# Patient Record
Sex: Female | Born: 1975 | Race: Black or African American | Hispanic: No | Marital: Single | Smoking: Never smoker
Health system: Southern US, Community
[De-identification: ages and names within clinical notes are randomized; demographics above are authoritative.]

## PROBLEM LIST (undated history)

## (undated) DIAGNOSIS — R112 Nausea with vomiting, unspecified: Secondary | ICD-10-CM

## (undated) DIAGNOSIS — D649 Anemia, unspecified: Secondary | ICD-10-CM

## (undated) DIAGNOSIS — Z9889 Other specified postprocedural states: Secondary | ICD-10-CM

## (undated) DIAGNOSIS — I10 Essential (primary) hypertension: Secondary | ICD-10-CM

## (undated) DIAGNOSIS — Z9289 Personal history of other medical treatment: Secondary | ICD-10-CM

## (undated) HISTORY — PX: ABDOMINAL HYSTERECTOMY: SHX81

---

## 2020-05-11 ENCOUNTER — Other Ambulatory Visit (HOSPITAL_COMMUNITY)
Admission: RE | Admit: 2020-05-11 | Discharge: 2020-05-11 | Disposition: A | Discharge status: Home / Self Care | Payer: Medicaid Other | Source: Ambulatory Visit | Attending: Orthopedic Surgery | Admitting: Orthopedic Surgery

## 2020-05-11 ENCOUNTER — Encounter (HOSPITAL_COMMUNITY): Payer: Self-pay | Admitting: Orthopedic Surgery

## 2020-05-11 DIAGNOSIS — Z01812 Encounter for preprocedural laboratory examination: Secondary | ICD-10-CM | POA: Insufficient documentation

## 2020-05-11 DIAGNOSIS — Z20822 Contact with and (suspected) exposure to covid-19: Secondary | ICD-10-CM | POA: Insufficient documentation

## 2020-05-11 LAB — SARS CORONAVIRUS 2 (TAT 6-24 HRS): SARS Coronavirus 2: NEGATIVE

## 2020-05-12 ENCOUNTER — Other Ambulatory Visit: Payer: Self-pay

## 2020-05-12 ENCOUNTER — Ambulatory Visit (HOSPITAL_COMMUNITY)
Admission: RE | Admit: 2020-05-12 | Discharge: 2020-05-12 | Disposition: A | Discharge status: Home / Self Care | Payer: Medicaid Other | Source: Ambulatory Visit | Attending: Orthopedic Surgery | Admitting: Orthopedic Surgery

## 2020-05-12 ENCOUNTER — Encounter (HOSPITAL_COMMUNITY): Payer: Self-pay | Admitting: Orthopedic Surgery

## 2020-05-12 ENCOUNTER — Other Ambulatory Visit (HOSPITAL_COMMUNITY): Payer: Self-pay | Admitting: Orthopedic Surgery

## 2020-05-12 ENCOUNTER — Other Ambulatory Visit: Payer: Self-pay | Admitting: Orthopedic Surgery

## 2020-05-12 DIAGNOSIS — M25571 Pain in right ankle and joints of right foot: Secondary | ICD-10-CM | POA: Diagnosis not present

## 2020-05-12 DIAGNOSIS — G8929 Other chronic pain: Secondary | ICD-10-CM | POA: Diagnosis present

## 2020-05-12 NOTE — H&P (Signed)
HPI:  Evelyn Moody is a pleasant 45 year old female who fell down some stairs while she was in [place] on 05/06/2020.  She had acute onset pain.  She actually went to one hospital and they shipped her to a second hospital.  She ultimately had a long-leg splint placed.  Pain is rated 8 out of 10.  She has had swelling.  She has not been able to put weight on it.  She has been using a wheelchair.  She is taking ibuprofen and Tylenol.    Past Medical History:  Diagnosis Date  . Hypertension         No family history on file.   Current Medications No current facility-administered medications for this encounter.   Marland Kitchen acetaminophen (TYLENOL) 500 MG tablet  . amLODipine (NORVASC) 10 MG tablet  . hydrochlorothiazide (HYDRODIURIL) 25 MG tablet  . ibuprofen (ADVIL) 600 MG tablet     Allergies Patient has no known allergies.  Social History  has no history on file for tobacco use, alcohol use, and drug use.    Physical Exam General: Well appearing, in NAD Mental status: Alert and Oriented x3 Lungs: CTA b/l anterior and posterior without crackles or wheeze Heart: RRR, no m/g/r appreciated Abdomen: +BS, soft, nt, nd, no masses, hernias, or organomegaly appreciated Neurological: Speech Clear and organized, no gross focal findings or movement disorder appreciated Musculoskeletal: no gross joint deformity or swelling.  Observed gait normal Extremities: RLE: Her splint is somewhat in disrepair.  Her sensation is intact throughout the toes.  No evidence for compartment syndrome.  EHL and FHL are intact.  Warm and well perfused w/o edema Skin: Warm and dry      X-RAYS: X-rays from the ER demonstrate a comminuted displaced distal tibia fracture with a midshaft fibula fracture   PLAN: This is an acute severe injury and carries risk for post-traumatic arthrosis and long-term dysfunction.  Recommended open reduction and internal fixation, but we need to optimize the soft tissues.  We will get a CAT  scan to better elucidate the posterior malleolus at the tibia. There may be involvement with impaction fracture there. The risks, alternatives, benefits were discussed at length with the pt. Who agrees with the plan and wishes to proceed.

## 2020-05-12 NOTE — Progress Notes (Signed)
COVID Vaccine Completed: No Date COVID Vaccine completed: x1 COVID vaccine manufacturer: Pfizer      PCP - [facility] [place] Cardiologist - N/A  Chest x-ray - N/A EKG -  Stress Test - greater than 2 years ECHO - N/A Cardiac Cath - N/A Pacemaker/ICD device last checked: N/A  Sleep Study - N/A CPAP - N/A  Fasting Blood Sugar - N/A Checks Blood Sugar _N/A____ times a day  Blood Thinner Instructions: N/A Aspirin Instructions: N/A Last Dose: N/A  Activity level:  Can go up a flight of stairs without stopping and without symptoms    Anesthesia review: N/A  Patient denies shortness of breath, fever, cough and chest pain at PAT appointment   Patient verbalized understanding of instructions that were given to them at the PAT appointment. Patient was also instructed that they will need to review over the PAT instructions again at home before surgery.

## 2020-05-14 ENCOUNTER — Encounter (HOSPITAL_COMMUNITY)
Admission: RE | Disposition: A | Discharge status: Home / Self Care | Payer: Self-pay | Source: Home / Self Care | Attending: Orthopedic Surgery

## 2020-05-14 ENCOUNTER — Encounter (HOSPITAL_COMMUNITY): Payer: Self-pay | Admitting: Orthopedic Surgery

## 2020-05-14 ENCOUNTER — Ambulatory Visit (HOSPITAL_COMMUNITY): Payer: Medicaid Other | Admitting: Anesthesiology

## 2020-05-14 ENCOUNTER — Ambulatory Visit (HOSPITAL_COMMUNITY): Payer: Medicaid Other

## 2020-05-14 ENCOUNTER — Other Ambulatory Visit: Payer: Self-pay

## 2020-05-14 ENCOUNTER — Observation Stay (HOSPITAL_COMMUNITY)
Admission: RE | Admit: 2020-05-14 | Discharge: 2020-05-16 | Disposition: A | Discharge status: Home / Self Care | Payer: Medicaid Other | Attending: Orthopedic Surgery | Admitting: Orthopedic Surgery

## 2020-05-14 DIAGNOSIS — S82201A Unspecified fracture of shaft of right tibia, initial encounter for closed fracture: Secondary | ICD-10-CM | POA: Diagnosis present

## 2020-05-14 DIAGNOSIS — S82871A Displaced pilon fracture of right tibia, initial encounter for closed fracture: Secondary | ICD-10-CM | POA: Diagnosis not present

## 2020-05-14 DIAGNOSIS — Z79899 Other long term (current) drug therapy: Secondary | ICD-10-CM | POA: Diagnosis not present

## 2020-05-14 DIAGNOSIS — Z419 Encounter for procedure for purposes other than remedying health state, unspecified: Secondary | ICD-10-CM

## 2020-05-14 DIAGNOSIS — W109XXA Fall (on) (from) unspecified stairs and steps, initial encounter: Secondary | ICD-10-CM | POA: Diagnosis not present

## 2020-05-14 DIAGNOSIS — I10 Essential (primary) hypertension: Secondary | ICD-10-CM | POA: Insufficient documentation

## 2020-05-14 DIAGNOSIS — S8991XA Unspecified injury of right lower leg, initial encounter: Secondary | ICD-10-CM | POA: Diagnosis present

## 2020-05-14 HISTORY — DX: Anemia, unspecified: D64.9

## 2020-05-14 HISTORY — DX: Essential (primary) hypertension: I10

## 2020-05-14 HISTORY — DX: Personal history of other medical treatment: Z92.89

## 2020-05-14 HISTORY — PX: ORIF TIBIA FRACTURE: SHX5416

## 2020-05-14 HISTORY — DX: Other specified postprocedural states: R11.2

## 2020-05-14 HISTORY — DX: Other specified postprocedural states: Z98.890

## 2020-05-14 LAB — CBC
HCT: 40.2 % (ref 36.0–46.0)
Hemoglobin: 12.8 g/dL (ref 12.0–15.0)
MCH: 28.6 pg (ref 26.0–34.0)
MCHC: 31.8 g/dL (ref 30.0–36.0)
MCV: 89.9 fL (ref 80.0–100.0)
Platelets: 325 10*3/uL (ref 150–400)
RBC: 4.47 MIL/uL (ref 3.87–5.11)
RDW: 13.5 % (ref 11.5–15.5)
WBC: 6.6 10*3/uL (ref 4.0–10.5)
nRBC: 0 % (ref 0.0–0.2)

## 2020-05-14 LAB — BASIC METABOLIC PANEL
Anion gap: 13 (ref 5–15)
BUN: 14 mg/dL (ref 6–20)
CO2: 26 mmol/L (ref 22–32)
Calcium: 9.4 mg/dL (ref 8.9–10.3)
Chloride: 106 mmol/L (ref 98–111)
Creatinine, Ser: 0.9 mg/dL (ref 0.44–1.00)
GFR, Estimated: >60 mL/min (ref >60)
Glucose, Bld: 97 mg/dL (ref 70–99)
Potassium: 4.2 mmol/L (ref 3.5–5.1)
Sodium: 145 mmol/L (ref 135–145)

## 2020-05-14 SURGERY — OPEN REDUCTION INTERNAL FIXATION (ORIF) TIBIA FRACTURE
Anesthesia: General | Laterality: Right

## 2020-05-14 MED ORDER — ROCURONIUM BROMIDE 100 MG/10ML IV SOLN
INTRAVENOUS | Status: DC | PRN
  Administered 2020-05-14: 50 mg via INTRAVENOUS
  Administered 2020-05-14: 10 mg via INTRAVENOUS

## 2020-05-14 MED ORDER — HYDROMORPHONE HCL 1 MG/ML IJ SOLN
0.2500 mg | INTRAMUSCULAR | Status: DC | PRN
Start: 2020-05-14 — End: 2020-05-14
  Administered 2020-05-14 (×4): 0.5 mg via INTRAVENOUS

## 2020-05-14 MED ORDER — METHOCARBAMOL 500 MG IVPB - SIMPLE MED
500.0000 mg | Freq: Four times a day (QID) | INTRAVENOUS | Status: DC | PRN
  Filled 2020-05-14: qty 50

## 2020-05-14 MED ORDER — ORAL CARE MOUTH RINSE: 15.0000 mL | Freq: Once | OROMUCOSAL | Status: AC

## 2020-05-14 MED ORDER — ONDANSETRON HCL 4 MG/2ML IJ SOLN: 4.0000 mg | Freq: Four times a day (QID) | INTRAMUSCULAR | Status: DC | PRN

## 2020-05-14 MED ORDER — ACETAMINOPHEN 500 MG PO TABS
1000.0000 mg | ORAL_TABLET | Freq: Four times a day (QID) | ORAL | Status: AC
  Administered 2020-05-14 – 2020-05-15 (×4): 1000 mg via ORAL
  Filled 2020-05-14 (×4): qty 2

## 2020-05-14 MED ORDER — PROPOFOL 10 MG/ML IV BOLUS
INTRAVENOUS | Status: DC | PRN
  Administered 2020-05-14: 200 mg via INTRAVENOUS

## 2020-05-14 MED ORDER — CEFAZOLIN SODIUM-DEXTROSE 2-4 GM/100ML-% IV SOLN
2.0000 g | Freq: Four times a day (QID) | INTRAVENOUS | Status: AC
Start: 2020-05-14 — End: 2020-05-15
  Administered 2020-05-14 – 2020-05-15 (×3): 2 g via INTRAVENOUS
  Filled 2020-05-14 (×4): qty 100

## 2020-05-14 MED ORDER — BUPIVACAINE HCL (PF) 0.5 % IJ SOLN
INTRAMUSCULAR | Status: AC
  Filled 2020-05-14: qty 30

## 2020-05-14 MED ORDER — ACETAMINOPHEN 500 MG PO TABS: 1000.0000 mg | ORAL_TABLET | Freq: Four times a day (QID) | ORAL | Status: DC

## 2020-05-14 MED ORDER — FENTANYL CITRATE (PF) 100 MCG/2ML IJ SOLN
INTRAMUSCULAR | Status: AC
  Filled 2020-05-14: qty 2

## 2020-05-14 MED ORDER — ONDANSETRON HCL 4 MG PO TABS
4.0000 mg | ORAL_TABLET | Freq: Four times a day (QID) | ORAL | Status: DC | PRN
  Administered 2020-05-15: 4 mg via ORAL
  Filled 2020-05-14: qty 1

## 2020-05-14 MED ORDER — OXYCODONE HCL 5 MG PO TABS: 5.0000 mg | ORAL_TABLET | ORAL | Status: DC | PRN

## 2020-05-14 MED ORDER — METOCLOPRAMIDE HCL 5 MG PO TABS: 5.0000 mg | ORAL_TABLET | Freq: Three times a day (TID) | ORAL | Status: DC | PRN

## 2020-05-14 MED ORDER — SENNOSIDES-DOCUSATE SODIUM 8.6-50 MG PO TABS
1.0000 | ORAL_TABLET | Freq: Every evening | ORAL | Status: DC | PRN
  Filled 2020-05-14: qty 1

## 2020-05-14 MED ORDER — CHLORHEXIDINE GLUCONATE 0.12 % MT SOLN
15.0000 mL | Freq: Once | OROMUCOSAL | Status: AC
  Administered 2020-05-14: 15 mL via OROMUCOSAL

## 2020-05-14 MED ORDER — PROPOFOL 10 MG/ML IV BOLUS
INTRAVENOUS | Status: AC
  Filled 2020-05-14: qty 20

## 2020-05-14 MED ORDER — LIDOCAINE HCL (PF) 2 % IJ SOLN
INTRAMUSCULAR | Status: AC
  Filled 2020-05-14: qty 5

## 2020-05-14 MED ORDER — ONDANSETRON HCL 4 MG/2ML IJ SOLN: 4.0000 mg | Freq: Once | INTRAMUSCULAR | Status: DC | PRN

## 2020-05-14 MED ORDER — SENNOSIDES-DOCUSATE SODIUM 8.6-50 MG PO TABS: 1.0000 | ORAL_TABLET | Freq: Every evening | ORAL | Status: DC | PRN

## 2020-05-14 MED ORDER — MIDAZOLAM HCL 5 MG/5ML IJ SOLN
INTRAMUSCULAR | Status: DC | PRN
  Administered 2020-05-14: 2 mg via INTRAVENOUS

## 2020-05-14 MED ORDER — SUCCINYLCHOLINE CHLORIDE 200 MG/10ML IV SOSY
PREFILLED_SYRINGE | INTRAVENOUS | Status: AC
  Filled 2020-05-14: qty 10

## 2020-05-14 MED ORDER — CEFAZOLIN SODIUM-DEXTROSE 2-4 GM/100ML-% IV SOLN
2.0000 g | INTRAVENOUS | Status: AC
  Administered 2020-05-14: 2 g via INTRAVENOUS
  Filled 2020-05-14: qty 100

## 2020-05-14 MED ORDER — OXYCODONE HCL 5 MG/5ML PO SOLN: 5.0000 mg | Freq: Once | ORAL | Status: DC | PRN

## 2020-05-14 MED ORDER — BISACODYL 10 MG RE SUPP
10.0000 mg | Freq: Every day | RECTAL | Status: DC | PRN
Start: 2020-05-14 — End: 2020-05-16

## 2020-05-14 MED ORDER — OXYCODONE HCL 5 MG PO TABS
5.0000 mg | ORAL_TABLET | ORAL | Status: DC | PRN
  Filled 2020-05-14 (×2): qty 2

## 2020-05-14 MED ORDER — DOCUSATE SODIUM 100 MG PO CAPS
100.0000 mg | ORAL_CAPSULE | Freq: Two times a day (BID) | ORAL | Status: DC
  Administered 2020-05-15 – 2020-05-16 (×3): 100 mg via ORAL
  Filled 2020-05-14 (×3): qty 1

## 2020-05-14 MED ORDER — SUGAMMADEX SODIUM 200 MG/2ML IV SOLN
INTRAVENOUS | Status: DC | PRN
  Administered 2020-05-14: 200 mg via INTRAVENOUS

## 2020-05-14 MED ORDER — HYDROMORPHONE HCL 1 MG/ML IJ SOLN
INTRAMUSCULAR | Status: AC
  Filled 2020-05-14: qty 1

## 2020-05-14 MED ORDER — LACTATED RINGERS IV SOLN: INTRAVENOUS | Status: DC

## 2020-05-14 MED ORDER — METOPROLOL TARTRATE 5 MG/5ML IV SOLN
INTRAVENOUS | Status: AC
  Filled 2020-05-14: qty 5

## 2020-05-14 MED ORDER — DEXMEDETOMIDINE (PRECEDEX) IN NS 20 MCG/5ML (4 MCG/ML) IV SYRINGE
PREFILLED_SYRINGE | INTRAVENOUS | Status: DC | PRN
  Administered 2020-05-14 (×5): 4 ug via INTRAVENOUS

## 2020-05-14 MED ORDER — ONDANSETRON HCL 4 MG/2ML IJ SOLN
INTRAMUSCULAR | Status: AC
  Filled 2020-05-14: qty 2

## 2020-05-14 MED ORDER — DIPHENHYDRAMINE HCL 12.5 MG/5ML PO ELIX
12.5000 mg | ORAL_SOLUTION | ORAL | Status: DC | PRN
  Filled 2020-05-14: qty 10

## 2020-05-14 MED ORDER — OXYCODONE HCL 5 MG PO TABS: 5.0000 mg | ORAL_TABLET | Freq: Once | ORAL | Status: DC | PRN

## 2020-05-14 MED ORDER — ROCURONIUM BROMIDE 100 MG/10ML IV SOLN: INTRAVENOUS | Status: DC | PRN

## 2020-05-14 MED ORDER — DEXMEDETOMIDINE (PRECEDEX) IN NS 20 MCG/5ML (4 MCG/ML) IV SYRINGE
PREFILLED_SYRINGE | INTRAVENOUS | Status: AC
  Filled 2020-05-14: qty 5

## 2020-05-14 MED ORDER — KETOROLAC TROMETHAMINE 30 MG/ML IJ SOLN: 30.0000 mg | Freq: Once | INTRAMUSCULAR | Status: DC | PRN

## 2020-05-14 MED ORDER — LABETALOL HCL 5 MG/ML IV SOLN
INTRAVENOUS | Status: DC | PRN
  Administered 2020-05-14 (×2): 5 mg via INTRAVENOUS
  Administered 2020-05-14: 10 mg via INTRAVENOUS

## 2020-05-14 MED ORDER — HYDROMORPHONE HCL 1 MG/ML IJ SOLN
0.5000 mg | INTRAMUSCULAR | Status: DC | PRN
  Filled 2020-05-14: qty 1

## 2020-05-14 MED ORDER — BUPIVACAINE HCL (PF) 0.5 % IJ SOLN
INTRAMUSCULAR | Status: DC | PRN
  Administered 2020-05-14: 30 mL via PERINEURAL

## 2020-05-14 MED ORDER — DEXAMETHASONE SODIUM PHOSPHATE 10 MG/ML IJ SOLN
INTRAMUSCULAR | Status: DC | PRN
  Administered 2020-05-14: 10 mg via INTRAVENOUS

## 2020-05-14 MED ORDER — BUPIVACAINE HCL (PF) 0.25 % IJ SOLN
INTRAMUSCULAR | Status: AC
  Filled 2020-05-14: qty 30

## 2020-05-14 MED ORDER — OXYCODONE HCL 5 MG PO TABS
10.0000 mg | ORAL_TABLET | ORAL | Status: DC | PRN
Start: 2020-05-14 — End: 2020-05-16
  Administered 2020-05-14: 10 mg via ORAL
  Administered 2020-05-15 (×2): 15 mg via ORAL
  Administered 2020-05-15: 10 mg via ORAL
  Administered 2020-05-16: 15 mg via ORAL
  Filled 2020-05-14 (×3): qty 3

## 2020-05-14 MED ORDER — DEXAMETHASONE SODIUM PHOSPHATE 10 MG/ML IJ SOLN
INTRAMUSCULAR | Status: AC
  Filled 2020-05-14: qty 1

## 2020-05-14 MED ORDER — 0.9 % SODIUM CHLORIDE (POUR BTL) OPTIME
TOPICAL | Status: DC | PRN
  Administered 2020-05-14: 1000 mL

## 2020-05-14 MED ORDER — ONDANSETRON HCL 4 MG/2ML IJ SOLN
INTRAMUSCULAR | Status: DC | PRN
  Administered 2020-05-14: 4 mg via INTRAVENOUS

## 2020-05-14 MED ORDER — SUCCINYLCHOLINE CHLORIDE 200 MG/10ML IV SOSY
PREFILLED_SYRINGE | INTRAVENOUS | Status: DC | PRN
  Administered 2020-05-14: 140 mg via INTRAVENOUS

## 2020-05-14 MED ORDER — METOPROLOL TARTRATE 5 MG/5ML IV SOLN
INTRAVENOUS | Status: DC | PRN
  Administered 2020-05-14: 2 mg via INTRAVENOUS
  Administered 2020-05-14: 3 mg via INTRAVENOUS

## 2020-05-14 MED ORDER — OXYCODONE HCL 5 MG PO TABS: 5.0000 mg | ORAL_TABLET | Freq: Three times a day (TID) | ORAL | 0 refills | Status: AC | PRN

## 2020-05-14 MED ORDER — AMISULPRIDE (ANTIEMETIC) 5 MG/2ML IV SOLN: 10.0000 mg | Freq: Once | INTRAVENOUS | Status: DC | PRN

## 2020-05-14 MED ORDER — FENTANYL CITRATE (PF) 250 MCG/5ML IJ SOLN
INTRAMUSCULAR | Status: AC
  Filled 2020-05-14: qty 5

## 2020-05-14 MED ORDER — HYDRALAZINE HCL 20 MG/ML IJ SOLN
INTRAMUSCULAR | Status: DC | PRN
  Administered 2020-05-14: 10 mg via INTRAVENOUS

## 2020-05-14 MED ORDER — HYDROMORPHONE HCL 1 MG/ML IJ SOLN: 0.5000 mg | INTRAMUSCULAR | Status: DC | PRN

## 2020-05-14 MED ORDER — LABETALOL HCL 5 MG/ML IV SOLN
INTRAVENOUS | Status: AC
  Filled 2020-05-14: qty 4

## 2020-05-14 MED ORDER — HYDRALAZINE HCL 20 MG/ML IJ SOLN
INTRAMUSCULAR | Status: AC
  Filled 2020-05-14: qty 1

## 2020-05-14 MED ORDER — ASPIRIN 81 MG PO CHEW: 81.0000 mg | CHEWABLE_TABLET | Freq: Two times a day (BID) | ORAL | 0 refills | Status: AC

## 2020-05-14 MED ORDER — ONDANSETRON HCL 4 MG PO TABS: 4.0000 mg | ORAL_TABLET | Freq: Every day | ORAL | 0 refills | Status: AC | PRN

## 2020-05-14 MED ORDER — LIDOCAINE HCL (CARDIAC) PF 100 MG/5ML IV SOSY
PREFILLED_SYRINGE | INTRAVENOUS | Status: DC | PRN
  Administered 2020-05-14: 100 mg via INTRAVENOUS

## 2020-05-14 MED ORDER — ROCURONIUM BROMIDE 10 MG/ML (PF) SYRINGE
PREFILLED_SYRINGE | INTRAVENOUS | Status: AC
  Filled 2020-05-14: qty 10

## 2020-05-14 MED ORDER — MIDAZOLAM HCL 2 MG/2ML IJ SOLN
INTRAMUSCULAR | Status: AC
  Filled 2020-05-14: qty 2

## 2020-05-14 MED ORDER — BISACODYL 10 MG RE SUPP
10.0000 mg | Freq: Every day | RECTAL | Status: DC | PRN
  Filled 2020-05-14: qty 1

## 2020-05-14 MED ORDER — METHOCARBAMOL 500 MG PO TABS: 500.0000 mg | ORAL_TABLET | Freq: Four times a day (QID) | ORAL | Status: DC | PRN

## 2020-05-14 MED ORDER — FENTANYL CITRATE (PF) 100 MCG/2ML IJ SOLN
INTRAMUSCULAR | Status: DC | PRN
  Administered 2020-05-14: 25 ug via INTRAVENOUS
  Administered 2020-05-14: 100 ug via INTRAVENOUS
  Administered 2020-05-14 (×3): 25 ug via INTRAVENOUS
  Administered 2020-05-14: 100 ug via INTRAVENOUS
  Administered 2020-05-14: 50 ug via INTRAVENOUS
  Administered 2020-05-14: 100 ug via INTRAVENOUS

## 2020-05-14 MED ORDER — OXYCODONE HCL 5 MG PO TABS: 10.0000 mg | ORAL_TABLET | ORAL | Status: DC | PRN

## 2020-05-14 MED ORDER — METOCLOPRAMIDE HCL 5 MG/ML IJ SOLN: 5.0000 mg | Freq: Three times a day (TID) | INTRAMUSCULAR | Status: DC | PRN

## 2020-05-14 SURGICAL SUPPLY — 96 items
BAG DECANTER FOR FLEXI CONT (MISCELLANEOUS) IMPLANT
BANDAGE ESMARK 6X9 LF (GAUZE/BANDAGES/DRESSINGS) ×2 IMPLANT
BIT DRILL 2.5 (BIT) ×1
BIT DRILL 3.1 (BIT) ×2 IMPLANT
BIT DRILL CANN 2.7 (BIT) ×1
BIT DRILL SHRT 216X2.5XNS LF (BIT) ×1 IMPLANT
BIT DRILL SRG 2.7XCANN AO CPLG (BIT) ×1 IMPLANT
BIT DRL SHRT 216X2.5XNS LF (BIT) ×1
BIT DRL SRG 2.7XCANN AO CPLNG (BIT) ×1
BLADE SURG 15 STRL LF DISP TIS (BLADE) ×2 IMPLANT
BLADE SURG 15 STRL SS (BLADE) ×2
BNDG ELASTIC 4X5.8 VLCR STR LF (GAUZE/BANDAGES/DRESSINGS) ×2 IMPLANT
BNDG ELASTIC 6X5.8 VLCR STR LF (GAUZE/BANDAGES/DRESSINGS) ×4 IMPLANT
BNDG ESMARK 6X9 LF (GAUZE/BANDAGES/DRESSINGS) ×4
CHLORAPREP W/TINT 26 (MISCELLANEOUS) ×2 IMPLANT
CLSR STERI-STRIP ANTIMIC 1/2X4 (GAUZE/BANDAGES/DRESSINGS) IMPLANT
COVER WAND RF STERILE (DRAPES) IMPLANT
CUFF TOURN SGL QUICK 34 (TOURNIQUET CUFF)
CUFF TRNQT CYL 34X4.125X (TOURNIQUET CUFF) IMPLANT
DRAPE C-ARM 42X120 X-RAY (DRAPES) ×2 IMPLANT
DRAPE C-ARMOR (DRAPES) ×2 IMPLANT
DRAPE EXTREMITY ABCS (DRAPES) ×2 IMPLANT
DRAPE EXTREMITY T 121X128X90 (DISPOSABLE) ×2 IMPLANT
DRAPE IMP U-DRAPE 54X76 (DRAPES) ×4 IMPLANT
DRAPE U-SHAPE 47X51 STRL (DRAPES) ×2 IMPLANT
DRSG ADAPTIC 3X8 NADH LF (GAUZE/BANDAGES/DRESSINGS) ×2 IMPLANT
ELECT PENCIL ROCKER SW 15FT (MISCELLANEOUS) ×2 IMPLANT
GAUZE SPONGE 4X4 12PLY STRL (GAUZE/BANDAGES/DRESSINGS) ×2 IMPLANT
GAUZE XEROFORM 1X8 LF (GAUZE/BANDAGES/DRESSINGS) ×2 IMPLANT
GLOVE BIO SURGEON STRL SZ7.5 (GLOVE) ×4 IMPLANT
GLOVE SRG 8 PF TXTR STRL LF DI (GLOVE) ×2 IMPLANT
GLOVE SURG PROTEXIS BL SZ6.5 (GLOVE) ×2 IMPLANT
GLOVE SURG UNDER POLY LF SZ8 (GLOVE) ×2
GOWN STRL REUS W/ TWL LRG LVL3 (GOWN DISPOSABLE) ×2 IMPLANT
GOWN STRL REUS W/ TWL XL LVL3 (GOWN DISPOSABLE) ×1 IMPLANT
GOWN STRL REUS W/TWL LRG LVL3 (GOWN DISPOSABLE) ×2
GOWN STRL REUS W/TWL XL LVL3 (GOWN DISPOSABLE) ×3 IMPLANT
IMMOBILIZER KNEE 20 (SOFTGOODS) ×2
IMMOBILIZER KNEE 20 THIGH 36 (SOFTGOODS) ×1 IMPLANT
IMMOBILIZER KNEE 22 UNIV (SOFTGOODS) IMPLANT
K-WIRE 2.0 (WIRE) ×1
K-WIRE FX234X2XDRL TIP NS (WIRE) ×1
K-WIRE ORTHOPEDIC 1.4X150L (WIRE) ×2
KIT 1/3 TUB PL 4H 49M (Orthopedic Implant) ×1 IMPLANT
KIT BASIN OR (CUSTOM PROCEDURE TRAY) ×2 IMPLANT
KWIRE FX234X2XDRL TIP NS (WIRE) ×1 IMPLANT
KWIRE ORTHOPEDIC 1.4X150L (WIRE) ×1 IMPLANT
MANIFOLD NEPTUNE II (INSTRUMENTS) ×2 IMPLANT
NDL SAFETY ECLIPSE 18X1.5 (NEEDLE) ×1 IMPLANT
NEEDLE HYPO 18GX1.5 SHARP (NEEDLE) ×1
NS IRRIG 1000ML POUR BTL (IV SOLUTION) ×2 IMPLANT
PACK TOTAL JOINT (CUSTOM PROCEDURE TRAY) ×2 IMPLANT
PAD CAST 4YDX4 CTTN HI CHSV (CAST SUPPLIES) ×4 IMPLANT
PADDING CAST COTTON 4X4 STRL (CAST SUPPLIES) ×4
PADDING CAST COTTON 6X4 STRL (CAST SUPPLIES) ×4 IMPLANT
PENCIL SMOKE EVACUATOR (MISCELLANEOUS) ×2 IMPLANT
PLATE TIB ANT RT 4H (Plate) ×2 IMPLANT
PROS 1/3 TUB PL 4H 49M (Orthopedic Implant) ×2 IMPLANT
SCREW 44X4.0MM (Screw) ×2 IMPLANT
SCREW BONE 38X3.5MM (Screw) ×2 IMPLANT
SCREW BONE 40X3.5MM (Screw) ×2 IMPLANT
SCREW BONE CORT 3.5X24MM (Screw) ×2 IMPLANT
SCREW CANC FT 4.0X20 (Screw) ×4 IMPLANT
SCREW CANC FT 4.0X28 (Screw) ×2 IMPLANT
SCREW CORT 28X3.5XST LCK NS (Screw) ×2 IMPLANT
SCREW CORT 30X3.5XST LCK NS (Screw) ×1 IMPLANT
SCREW CORT 32X3.5XST LCK NS (Screw) ×1 IMPLANT
SCREW CORT 34X3.5XST LCK NS (Screw) ×1 IMPLANT
SCREW CORTICAL 3.5X28MM (Screw) ×2 IMPLANT
SCREW CORTICAL 3.5X30MM (Screw) ×1 IMPLANT
SCREW CORTICAL 3.5X32MM (Screw) ×1 IMPLANT
SCREW CORTICAL 3.5X34MM (Screw) ×1 IMPLANT
SLEEVE SCD COMPRESS KNEE MED (MISCELLANEOUS) ×2 IMPLANT
SPLINT PLASTER CAST XFAST 5X30 (CAST SUPPLIES) ×1 IMPLANT
SPLINT PLASTER XFAST SET 5X30 (CAST SUPPLIES) ×1
SPONGE LAP 18X18 RF (DISPOSABLE) ×2 IMPLANT
SPONGE LAP 4X18 RFD (DISPOSABLE) ×2 IMPLANT
STAPLER VISISTAT 35W (STAPLE) IMPLANT
SUCTION FRAZIER HANDLE 10FR (MISCELLANEOUS) ×1
SUCTION TUBE FRAZIER 10FR DISP (MISCELLANEOUS) ×1 IMPLANT
SUT ETHILON 3 0 PS 1 (SUTURE) ×8 IMPLANT
SUT FIBERWIRE #2 38 T-5 BLUE (SUTURE)
SUT MNCRL AB 4-0 PS2 18 (SUTURE) ×2 IMPLANT
SUT MON AB 2-0 CT1 36 (SUTURE) ×2 IMPLANT
SUT STEEL 7 (SUTURE) IMPLANT
SUT VIC AB 0 CT1 27 (SUTURE) ×1
SUT VIC AB 0 CT1 27XBRD ANBCTR (SUTURE) ×1 IMPLANT
SUT VIC AB 1 CT1 27 (SUTURE) ×1
SUT VIC AB 1 CT1 27XBRD ANBCTR (SUTURE) ×1 IMPLANT
SUT VIC AB 2-0 SH 27 (SUTURE)
SUT VIC AB 2-0 SH 27XBRD (SUTURE) IMPLANT
SUTURE FIBERWR #2 38 T-5 BLUE (SUTURE) IMPLANT
SYR BULB EAR ULCER 3OZ GRN STR (SYRINGE) ×2 IMPLANT
SYR CONTROL 10ML LL (SYRINGE) ×2 IMPLANT
UNDERPAD 30X36 HEAVY ABSORB (UNDERPADS AND DIAPERS) ×2 IMPLANT
YANKAUER SUCT BULB TIP NO VENT (SUCTIONS) ×2 IMPLANT

## 2020-05-14 NOTE — Anesthesia Preprocedure Evaluation (Addendum)
Anesthesia Evaluation  Patient identified by MRN, date of birth, ID band Patient awake    Reviewed: Allergy & Precautions, H&P , NPO status , Patient's Chart, lab work & pertinent test results  History of Anesthesia Complications (+) PONV  Airway Mallampati: II  TM Distance: >3 FB Neck ROM: Full    Dental no notable dental hx.    Pulmonary neg pulmonary ROS,    Pulmonary exam normal breath sounds clear to auscultation       Cardiovascular hypertension, Pt. on medications Normal cardiovascular exam Rhythm:Regular Rate:Normal  Totally uncontrolled HTN Has not taken meds in at least 3 days   Neuro/Psych negative neurological ROS  negative psych ROS   GI/Hepatic negative GI ROS, Neg liver ROS,   Endo/Other  Morbid obesity  Renal/GU negative Renal ROS  negative genitourinary   Musculoskeletal negative musculoskeletal ROS (+)   Abdominal   Peds negative pediatric ROS (+)  Hematology negative hematology ROS (+)   Anesthesia Other Findings   Reproductive/Obstetrics negative OB ROS                            Anesthesia Physical Anesthesia Plan  ASA: III  Anesthesia Plan: General   Post-op Pain Management:    Induction: Intravenous  PONV Risk Score and Plan: 4 or greater and Ondansetron, Dexamethasone, Midazolam and Scopolamine patch - Pre-op  Airway Management Planned: Oral ETT  Additional Equipment:   Intra-op Plan:   Post-operative Plan: Extubation in OR  Informed Consent: I have reviewed the patients History and Physical, chart, labs and discussed the procedure including the risks, benefits and alternatives for the proposed anesthesia with the patient or authorized representative who has indicated his/her understanding and acceptance.     Dental advisory given  Plan Discussed with: CRNA and Surgeon  Anesthesia Plan Comments:         Anesthesia Quick Evaluation

## 2020-05-14 NOTE — Anesthesia Postprocedure Evaluation (Signed)
Anesthesia Post Note  Patient: Evelyn Moody  Procedure(s) Performed: OPEN REDUCTION INTERNAL FIXATION (ORIF) TIBIA FRACTURE (Right )     Patient location during evaluation: PACU Anesthesia Type: General Level of consciousness: awake and alert Pain management: pain level controlled Vital Signs Assessment: post-procedure vital signs reviewed and stable Respiratory status: spontaneous breathing, nonlabored ventilation, respiratory function stable and patient connected to nasal cannula oxygen Cardiovascular status: blood pressure returned to baseline and stable Postop Assessment: no apparent nausea or vomiting Anesthetic complications: no   No complications documented.  Last Vitals:  Vitals:   05/14/20 1720 05/14/20 1730  BP:  (!) 158/110  Pulse: 94 88  Resp: 16 18  Temp:    SpO2: 100% 99%    Last Pain:  Vitals:   05/14/20 1730  TempSrc:   PainSc: 0-No pain                 Aidin Doane S

## 2020-05-14 NOTE — Discharge Instructions (Addendum)
It is very important for you to Elevate your leg - Toes above nose as much as possible to reduce pain / swelling. If needed, you may increase pain medication for the first few days post op to 2 tablets every 4 hours.  Weight Bearing:  Non weight bearing on affected leg.  Diet: As you were doing prior to hospitalization   Shower:  You have a splint on, leave the splint in place and keep the splint dry with a plastic bag.  Dressing:  You have a splint. Leave the splint in place and we will change your bandages during your first follow-up appointment.  You may loosen and re-apply ace wrap if it feels too tight. You also have a knee immobilizer brace on your knee because you had a nerve block placed in your leg pre-operatively. Once the nerve block wears off, you can remove the knee immobilizer but keep the splint on your lower leg and ACE wrap in place.     Activity:  Increase activity slowly as tolerated, but follow the weight bearing instructions below.  The rules on driving is that you can not be taking narcotics while you drive, and you must feel in control of the vehicle. If you had surgery on your right leg, you cannot drive while in a splint.    To prevent constipation:  Narcotic medicines cause constipation.  Wean these as soon as is appropriate.   You may use a stool softener such as -  - Colace (over the counter) 100 mg by mouth twice a day  - Drink plenty of fluids (prune juice may be helpful) and high fiber foods - Miralax (over the counter) for constipation as needed.    Itching:  If you experience itching with your medications, try taking only a single pain pill, or even half a pain pill at a time.  You can also use benadryl over the counter for itching or also to help with sleep.   Precautions:  If you experience chest pain or shortness of breath - call 911 immediately for transfer to the hospital emergency department!!  If you develop a fever greater that 101 F, purulent  drainage from wound, increased redness or drainage from wound, or calf pain -- Call the office at [phone]                                                 Follow- Up Appointment:  Please call the office ASAP for an appointment to be seen in 1-2 weeks by Dr. Eulah Pont - [phone]

## 2020-05-14 NOTE — Anesthesia Procedure Notes (Signed)
Procedure Name: Intubation Date/Time: 05/14/2020 2:02 PM Performed by: Jonna Munro, CRNA Pre-anesthesia Checklist: Patient identified, Emergency Drugs available, Suction available, Patient being monitored and Timeout performed Patient Re-evaluated:Patient Re-evaluated prior to induction Oxygen Delivery Method: Circle system utilized Preoxygenation: Pre-oxygenation with 100% oxygen Induction Type: IV induction Ventilation: Mask ventilation without difficulty Laryngoscope Size: Mac and 3 Grade View: Grade I Tube type: Oral Tube size: 7.0 mm Number of attempts: 1 Airway Equipment and Method: Stylet Placement Confirmation: ETT inserted through vocal cords under direct vision,  positive ETCO2 and breath sounds checked- equal and bilateral Secured at: 22 cm Tube secured with: Tape Dental Injury: Teeth and Oropharynx as per pre-operative assessment

## 2020-05-14 NOTE — Anesthesia Procedure Notes (Signed)
Anesthesia Procedure Image    

## 2020-05-14 NOTE — Anesthesia Procedure Notes (Signed)
Anesthesia Regional Block: Femoral nerve block   Pre-Anesthetic Checklist: ,, timeout performed, Correct Patient, Correct Site, Correct Laterality, Correct Procedure, Correct Position, site marked, Risks and benefits discussed,  Surgical consent,  Pre-op evaluation,  At surgeon's request and post-op pain management  Laterality: Right  Prep: chloraprep       Needles:  Injection technique: Single-shot  Needle Type: Echogenic Needle     Needle Length: 9cm      Additional Needles:   Procedures:,,,, ultrasound used (permanent image in chart),,,,  Narrative:  Start time: 05/14/2020 1:40 PM End time: 05/14/2020 1:45 PM Injection made incrementally with aspirations every 5 mL.  Performed by: Personally  Anesthesiologist: Eilene Ghazi, MD  Additional Notes: Patient tolerated the procedure well without complications

## 2020-05-14 NOTE — Transfer of Care (Signed)
Immediate Anesthesia Transfer of Care Note  Patient: Tony Friscia  Procedure(s) Performed: OPEN REDUCTION INTERNAL FIXATION (ORIF) TIBIA FRACTURE (Right )  Patient Location: PACU  Anesthesia Type:GA combined with regional for post-op pain  Level of Consciousness: awake, alert , oriented and patient cooperative  Airway & Oxygen Therapy: Patient Spontanous Breathing and Patient connected to face mask oxygen  Post-op Assessment: Report given to RN and Post -op Vital signs reviewed and stable  Post vital signs: Reviewed and stable  Last Vitals:  Vitals Value Taken Time  BP 168/104 05/14/20 1615  Temp    Pulse 96 05/14/20 1616  Resp 20 05/14/20 1616  SpO2 100 % 05/14/20 1616  Vitals shown include unvalidated device data.  Last Pain:  Vitals:   05/14/20 1201  TempSrc:   PainSc: 0-No pain      Patients Stated Pain Goal: 3 (05/12/20 1623)  Complications: No complications documented.

## 2020-05-14 NOTE — Interval H&P Note (Signed)
History and Physical Interval Note:  05/14/2020 11:38 AM  Evelyn Moody  has presented today for surgery, with the diagnosis of RIGHT TIBIA PILON FRACTURE.  The various methods of treatment have been discussed with the patient and family. After consideration of risks, benefits and other options for treatment, the patient has consented to  Procedure(s): OPEN REDUCTION INTERNAL FIXATION (ORIF) TIBIA FRACTURE (Right) as a surgical intervention.  The patient's history has been reviewed, patient examined, no change in status, stable for surgery.  I have reviewed the patient's chart and labs.  Questions were answered to the patient's satisfaction.     Sheral Apley

## 2020-05-15 DIAGNOSIS — S82871A Displaced pilon fracture of right tibia, initial encounter for closed fracture: Secondary | ICD-10-CM | POA: Diagnosis not present

## 2020-05-15 MED ORDER — HYDROCHLOROTHIAZIDE 25 MG PO TABS
25.0000 mg | ORAL_TABLET | Freq: Once | ORAL | Status: AC
  Administered 2020-05-15: 25 mg via ORAL
  Filled 2020-05-15: qty 1

## 2020-05-15 MED ORDER — AMLODIPINE BESYLATE 10 MG PO TABS
10.0000 mg | ORAL_TABLET | Freq: Once | ORAL | Status: AC
  Administered 2020-05-15: 10 mg via ORAL
  Filled 2020-05-15: qty 1

## 2020-05-15 MED ORDER — INFLUENZA VAC SPLIT QUAD 0.5 ML IM SUSY
0.5000 mL | PREFILLED_SYRINGE | INTRAMUSCULAR | Status: DC
  Filled 2020-05-15: qty 0.5

## 2020-05-15 NOTE — Progress Notes (Signed)
    Subjective: Patient reports pain as moderate.  Tolerating diet.  Urinating.  +Flatus.  No CP, SOB.  Not yet OOB  Objective:   VITALS:   Vitals:   05/14/20 2152 05/14/20 2331 05/15/20 0222 05/15/20 0623  BP: (!) 150/110 (!) 160/110 (!) 144/88 (!) 164/82  Pulse: 100 100 100 95  Resp:    18  Temp:    98.8 F (37.1 C)  TempSrc:   Oral Oral  SpO2:   100% 95%  Weight:      Height:       CBC Latest Ref Rng & Units 05/14/2020  WBC 4.0 - 10.5 K/uL 6.6  Hemoglobin 12.0 - 15.0 g/dL 42.3  Hematocrit 53.6 - 46.0 % 40.2  Platelets 150 - 400 K/uL 325   BMP Latest Ref Rng & Units 05/14/2020  Glucose 70 - 99 mg/dL 97  BUN 6 - 20 mg/dL 14  Creatinine 1.44 - 3.15 mg/dL 4.00  Sodium 867 - 619 mmol/L 145  Potassium 3.5 - 5.1 mmol/L 4.2  Chloride 98 - 111 mmol/L 106  CO2 22 - 32 mmol/L 26  Calcium 8.9 - 10.3 mg/dL 9.4   Intake/Output      01/06 0701 01/07 0700 01/07 0701 01/08 0700   I.V. (mL/kg) 1300 (11.1)    IV Piggyback 100    Total Intake(mL/kg) 1400 (11.9)    Urine (mL/kg/hr) 1750    Blood 5    Total Output 1755    Net -355         Urine Occurrence 2 x       Physical Exam: General: NAD.  Resting in bed Resp: No increased wob Cardio: regular rate and rhythm ABD soft Neurologically intact MSK Neurovascularly intact Sensation intact distally Intact pulses distally Dorsiflexion/Plantar flexion intact Incision: dressing C/D/I Intact pulses distally  Assessment: 1 Day Post-Op  S/P Procedure(s) (LRB): OPEN REDUCTION INTERNAL FIXATION (ORIF) TIBIA FRACTURE (Right) by Dr. Jewel Baize. Murphy on 05/14/20  She is concerned about getting into her house with NWB to RLE.  Active Problems:   Right tibial fracture     Plan:  Advance diet Up with therapy D/C IV fluids Incentive Spirometry Elevate and Apply ice  Weightbearing: NWB RLE Insicional and dressing care: Dressings left intact until follow-up Orthopedic device(s): None and Splint Showering: Keep  dressing dry VTE prophylaxis: ASA 81mg  BID  30 days, SCDs, ambulation Pain control: Tylenol, Oxycodone Will get PT/OT evaluation/Treatment to try and teach pt. Good techniques to get into her house safely.  Follow - up plan: 2 weeks Contact information:  MD, Margarita Rana PA-C  Dispo: Home     Aquilla Hacker, [place] 05/15/2020, 7:09 AM

## 2020-05-15 NOTE — TOC Initial Note (Signed)
Transition of Care [facility]) - Initial/Assessment Note    Patient Details  Name: Evelyn Moody MRN: [id number] Date of Birth: [birth year]  [facility] Care [facility]) CM/SW Contact:    Bartholome Bill, RN Phone Number: 05/15/2020, 2:49 PM  Clinical Narrative:                 HHPT ordered by MD. Choice offered and [facility] chosen. [facility] liaison contacted for referral. RW and drop arm BSC ordered. [employer] contacted for DME. DME will be delivered to pt room.           Expected Discharge Date: 05/16/20                         Activities of Daily Living Home Assistive Devices/Equipment: Eyeglasses,Crutches ADL Screening (condition at time of admission) Patient's cognitive ability adequate to safely complete daily activities?: Yes Is the patient deaf or have difficulty hearing?: No Does the patient have difficulty seeing, even when wearing glasses/contacts?: No Does the patient have difficulty concentrating, remembering, or making decisions?: No Patient able to express need for assistance with ADLs?: Yes Does the patient have difficulty dressing or bathing?: Yes Independently performs ADLs?: No Communication: Independent Does the patient have difficulty walking or climbing stairs?: Yes Weakness of Legs: Right Weakness of Arms/Hands: None  Permission Sought/Granted                  Emotional Assessment              Admission diagnosis:  Right tibial fracture [S82.201A] Patient Active Problem List   Diagnosis Date Noted  . Right tibial fracture 05/14/2020   PCP:  System, Provider Not In Pharmacy:   [facility] [address] [place] - [address] [place] [address] Phone: [phone] Fax: [phone]     Social Determinants of Health (SDOH) Interventions    Readmission Risk Interventions No flowsheet data found.

## 2020-05-15 NOTE — Evaluation (Signed)
Physical Therapy Evaluation Patient Details Name: Evelyn Moody MRN: [id number] DOB: July 27, 1975 Today's Date: 05/15/2020   History of Present Illness  [name] is a pleasant 45 year old female with HTN who fell down some stairs while she was in [place] on 05/06/2020 and is s/p R tibia ORIF on 05/14/20.  Clinical Impression  Pt admitted with above diagnosis. Pt currently requiring min A to get to sitting EOB and min G to slide transfer from bed to drop arm chair. Upon transfer to chair, pt becomes nauseas and throws up x2 in emesis bag- RN notified. Pt unable to stand at bedside or take steps this session. Extensive education regarding navigating 2 curbs to get into/out of apartment, provided illustration and communicated with family via facetime during eval, unsure of carryover. Pt with 2 sons at home, one in school and one works nights, but unsure how much physical assist they can provide. Pt currently with functional limitations due to the deficits listed below (see PT Problem List). Pt will benefit from skilled PT to increase their independence and safety with mobility to allow discharge to the venue listed below.        Follow Up Recommendations SNF;Home health PT (pending progress)    Equipment Recommendations  Rolling walker with 5" wheels;3in1 (PT) (drop arm BSC)    Recommendations for Other Services       Precautions / Restrictions Precautions Precautions: Fall Restrictions Weight Bearing Restrictions: Yes RLE Weight Bearing: Non weight bearing      Mobility  Bed Mobility Overal bed mobility: Needs Assistance Bed Mobility: Supine to Sit  Supine to sit: Min assist    General bed mobility comments: min A for RLE management to bring to EOB    Transfers Overall transfer level: Needs assistance Equipment used: None Transfers: Lateral/Scoot Transfers     Lateral/Scoot Transfers: Min guard General transfer comment: pt able to slide from bed to drop arm chair wtih min G, cues  for hand placement, unable to stand from EOB  Ambulation/Gait  General Gait Details: unable  Stairs            Wheelchair Mobility    Modified Rankin (Stroke Patients Only)       Balance Overall balance assessment: Needs assistance Sitting-balance support: Feet supported;Bilateral upper extremity supported Sitting balance-Leahy Scale: Good Sitting balance - Comments: seated EOB              Pertinent Vitals/Pain Pain Assessment: Faces Faces Pain Scale: Hurts even more Pain Location: R LE Pain Descriptors / Indicators: Sharp;Stabbing;Tingling;Cramping;Aching Pain Intervention(s): Limited activity within patient's tolerance;Monitored during session;Premedicated before session;Repositioned    Home Living Family/patient expects to be discharged to:: Private residence Living Arrangements: Children Available Help at Discharge: Family;Available 24 hours/day Type of Home: Apartment Home Access: Stairs to enter Entrance Stairs-Rails: None Entrance Stairs-Number of Steps: 1 curb from parking lot to sidewalk, 1 curb from sidewalk to apartment building Home Layout: One level Home Equipment: Crutches      Prior Function Level of Independence: Independent         Comments: Pt reports independent with ADLs, drives, ambulates community distances, works in [employer].     Hand Dominance   Dominant Hand: Right    Extremity/Trunk Assessment   Upper Extremity Assessment Upper Extremity Assessment: Overall WFL for tasks assessed    Lower Extremity Assessment Lower Extremity Assessment: RLE deficits/detail RLE Deficits / Details: actively wiggling toes, able to complete SLR ~2 inches off bed without extensor lag RLE: Unable to fully  assess due to pain RLE Sensation: decreased light touch    Cervical / Trunk Assessment Cervical / Trunk Assessment: Normal  Communication   Communication: No difficulties  Cognition Arousal/Alertness: Awake/alert Behavior  During Therapy: WFL for tasks assessed/performed Overall Cognitive Status: Within Functional Limits for tasks assessed         General Comments      Exercises General Exercises - Lower Extremity Quad Sets: Supine;Strengthening;Right;5 reps   Assessment/Plan    PT Assessment Patient needs continued PT services  PT Problem List Decreased strength;Decreased range of motion;Decreased activity tolerance;Decreased balance;Decreased mobility;Decreased knowledge of use of DME;Obesity;Pain       PT Treatment Interventions DME instruction;Gait training;Stair training;Functional mobility training;Therapeutic activities;Therapeutic exercise;Balance training;Patient/family education    PT Goals (Current goals can be found in the Care Plan section)  Acute Rehab PT Goals Patient Stated Goal: decrease pain in R leg PT Goal Formulation: With patient Time For Goal Achievement: 05/29/20 Potential to Achieve Goals: Good    Frequency Min 6X/week   Barriers to discharge        Co-evaluation               AM-PAC PT "6 Clicks" Mobility  Outcome Measure Help needed turning from your back to your side while in a flat bed without using bedrails?: A Little Help needed moving from lying on your back to sitting on the side of a flat bed without using bedrails?: A Little Help needed moving to and from a bed to a chair (including a wheelchair)?: Total Help needed standing up from a chair using your arms (e.g., wheelchair or bedside chair)?: Total Help needed to walk in hospital room?: Total Help needed climbing 3-5 steps with a railing? : Total 6 Click Score: 10    End of Session   Activity Tolerance: Patient tolerated treatment well Patient left: in chair;with call bell/phone within reach Nurse Communication: Mobility status;Other (comment) (KI status, pt nausea/vomiting) PT Visit Diagnosis: Unsteadiness on feet (R26.81);Other abnormalities of gait and mobility (R26.89);Muscle weakness  (generalized) (M62.81);Pain Pain - Right/Left: Right Pain - part of body: Leg    Time: 2703-5009 PT Time Calculation (min) (ACUTE ONLY): 55 min   Charges:   PT Evaluation $PT Eval Moderate Complexity: 1 Mod           Tori Hadiyah Maricle PT, DPT 05/15/20, 1:19 PM

## 2020-05-16 DIAGNOSIS — S82871A Displaced pilon fracture of right tibia, initial encounter for closed fracture: Secondary | ICD-10-CM | POA: Diagnosis not present

## 2020-05-16 MED ORDER — ACETAMINOPHEN 500 MG PO TABS
1000.0000 mg | ORAL_TABLET | Freq: Three times a day (TID) | ORAL | Status: DC
  Administered 2020-05-16: 1000 mg via ORAL
  Filled 2020-05-16: qty 2

## 2020-05-16 NOTE — TOC Progression Note (Signed)
Transition of Care [facility]) - Progression Note    Patient Details  Name: Evelyn Moody MRN: [id number] Date of Birth: [birth year]  [facility]) CM/SW Contact  Armanda Heritage, RN Phone Number: 05/16/2020, 1:49 PM  Clinical Narrative:    Referral for DME wheelchair given to [employer] rep Lucrecia.        Expected Discharge Plan and Services           Expected Discharge Date: 05/16/20               DME Arranged: Wheelchair manual DME Agency: [employer] Date DME Agency Contacted: 05/16/20 Time DME Agency Contacted: [phone] Representative spoke with at DME Agency: Lucrecia             Social Determinants of Health (SDOH) Interventions    Readmission Risk Interventions No flowsheet data found.

## 2020-05-16 NOTE — Progress Notes (Signed)
Physical Therapy Treatment Patient Details Name: Evelyn Moody MRN: [id number] DOB: 21-Mar-1976 Today's Date: 05/16/2020    History of Present Illness Evelyn Moody is a pleasant 45 year old female with HTN who fell down some stairs while she was in [place] on 05/06/2020 and is s/p R tibia ORIF on 05/14/20.    PT Comments    Pt agreeable to PT and with good pain control today.  She was educated on and demonstrated safe transfer technique with NWB status.  Educated on and demonstrated safe exercises with gait belt to provide AAROM for exercises and bed mobility - pt very pleased with this option to make her more independent.  Increased time spent discussing safety and problem solving how to perform curb transfers. Due to NWB status and limited mobility feel that pt would benefit from w/c to increase independence with mobility and her safety (has been having family roll her around in desk chair at home).  Recommend use of w/c for curb transfers.  Pt does have 1 curb that is 6" high and if family unable to use w/c over this discussed safe method to perform (see below).  Pt will continue to benefit from PT while hospitalized, but does demonstrate mobility necessary to return home with family and DME listed below.  Notified case manager pt will need w/c.    Follow Up Recommendations  Home health PT;Supervision/Assistance - 24 hour     Equipment Recommendations  Rolling walker with 5" wheels;3in1 (PT);Other (comment);Wheelchair (measurements PT);Wheelchair cushion (measurements PT) (w/c with elevating leg rest)    Recommendations for Other Services       Precautions / Restrictions Precautions Precautions: Fall Restrictions RLE Weight Bearing: Non weight bearing    Mobility  Bed Mobility               General bed mobility comments: in chair at arrival; Mod I with OT this morning  Transfers Overall transfer level: Needs assistance Equipment used: Rolling walker (2 wheeled) Transfers: Sit  to/from [employer] Sit to Stand: Min guard Stand pivot transfers: Min guard       General transfer comment: Min guard for safety; cues to push down into RW for pivot  Ambulation/Gait Ambulation/Gait assistance: Min assist Gait Distance (Feet): 3 Feet Assistive device: Rolling walker (2 wheeled)   Gait velocity: decraeased   General Gait Details: Hop on L LE; Min A to stabilize RW and support pt; cues to push into walker; increased effort from pt; good maintenance of NWB   Stairs Stairs:  Educated on safe use of w/c to get up curb with family assist, gave handout. Pt does have 1 curb that is 6" - discussed if unable to do with w/c could stand, turn backward, sit down in wheel -chair that is already up curve.           Wheelchair Mobility    Modified Rankin (Stroke Patients Only)       Balance Overall balance assessment: Needs assistance Sitting-balance support: Feet supported;No upper extremity supported Sitting balance-Leahy Scale: Good     Standing balance support: Bilateral upper extremity supported;Single extremity supported;No upper extremity supported Standing balance-Leahy Scale: Fair Standing balance comment: Uses single UE support or RW for pivots and ADLs; can static stand on L LE without UE support                            Cognition Arousal/Alertness: Awake/alert Behavior During Therapy: WFL for tasks assessed/performed Overall  Cognitive Status: Within Functional Limits for tasks assessed                                        Exercises General Exercises - Lower Extremity Quad Sets: AROM;Both;10 reps;Supine Long Arc Quad: Right;10 reps;Seated;AAROM Heel Slides: AAROM;Right;10 reps;Supine (showed how to use gait belt to assist) Hip ABduction/ADduction: AAROM;Right;10 reps;Supine (showed how to use gait belt to assist)    General Comments General comments (skin integrity, edema, etc.): VSS; Educated on  safe exercises (provided handout), curb with w/c (provided handout), use of w/c, and home safety.  Ace wrap was loose/wrinkled at top of splint, pt asking if could be rewrapped and if she could rewrap at home.  PT unwrapped and re-wrapped top 2/3 of ACE wrap to straighten and secure -pt reports feels much better. Advised that if top half of ACE wrap needed re-wrapping/straightening at home she could do that but instructed to not go further unless cleared by MD so that splint stays in place.      Pertinent Vitals/Pain Pain Assessment: 0-10 Pain Score: 2  Pain Location: R LE Pain Descriptors / Indicators: Sore Pain Intervention(s): Limited activity within patient's tolerance;Monitored during session;Premedicated before session    Home Living                      Prior Function            PT Goals (current goals can now be found in the care plan section) Acute Rehab PT Goals Patient Stated Goal: decrease pain in R leg PT Goal Formulation: With patient Time For Goal Achievement: 05/29/20 Potential to Achieve Goals: Good Progress towards PT goals: Progressing toward goals    Frequency    Min 6X/week      PT Plan Discharge plan needs to be updated;Equipment recommendations need to be updated    Co-evaluation              AM-PAC PT "6 Clicks" Mobility   Outcome Measure  Help needed turning from your back to your side while in a flat bed without using bedrails?: A Little Help needed moving from lying on your back to sitting on the side of a flat bed without using bedrails?: A Little Help needed moving to and from a bed to a chair (including a wheelchair)?: A Little Help needed standing up from a chair using your arms (e.g., wheelchair or bedside chair)?: A Little Help needed to walk in hospital room?: A Little Help needed climbing 3-5 steps with a railing? : A Lot 6 Click Score: 17    End of Session Equipment Utilized During Treatment: Gait belt Activity  Tolerance: Patient tolerated treatment well Patient left: in chair;with call bell/phone within reach Nurse Communication: Mobility status PT Visit Diagnosis: Unsteadiness on feet (R26.81);Other abnormalities of gait and mobility (R26.89);Muscle weakness (generalized) (M62.81);Pain Pain - Right/Left: Right Pain - part of body: Leg     Time: 7829-5621 PT Time Calculation (min) (ACUTE ONLY): 47 min  Charges:  $Therapeutic Exercise: 8-22 mins $Therapeutic Activity: 23-37 mins                     Anise Salvo, PT Acute Rehab Services Pager [phone] [facility] [phone]     Rayetta Humphrey 05/16/2020, 2:50 PM

## 2020-05-16 NOTE — Progress Notes (Signed)
PT Therapy Note  Patient Details Name: Evelyn Moody MRN: [id number] DOB: May 18, 1975   Pt would benefit from w/c with  elevating leg rest.  She had tibia fx with ORIF and is NWB.  Needs w/c for mobility due to NWB status and has 3 curbs/platform steps to enter home. Needs elevated leg rest for pain/edema control.    Anise Salvo, PT Acute Rehab Services Pager [phone] [facility] [phone]          Rayetta Humphrey 05/16/2020, 12:38 PM

## 2020-05-16 NOTE — Evaluation (Signed)
Occupational Therapy Evaluation Patient Details Name: Evelyn Moody MRN: [id number] DOB: [birth year] Today's Date: 05/16/2020    History of Present Illness Evelyn Moody is a pleasant 45 year old female with HTN who fell down some stairs while she was in [place] on 05/06/2020 and is s/p R tibia ORIF on 05/14/20.   Clinical Impression   PTA, pt was living at home with her 2 sons, pt reports she was independent with ADL/IADL and functional mobility. Pt reports she has been managing NWB at home since the fall with assistance from family for transfers. Pt currently requires minguard to minA for stand-pivot transfers and completion of full body washup standing and sitting and oral care completed in standing while maintaining NWB status. Pt demonstrates good safety awareness and excellent adherence to NWB status. Educated pt on fall prevention strategies. Due to decline in current level of function, pt would benefit from acute OT to address established goals to facilitate safe D/C to venue listed below. At this time, recommend HHOT follow-up. Will continue to follow acutely.     Follow Up Recommendations  Home health OT;Supervision - Intermittent (with mobility)    Equipment Recommendations  3 in 1 bedside commode;Other (comment) (drop arm)    Recommendations for Other Services       Precautions / Restrictions Precautions Precautions: Fall Restrictions Weight Bearing Restrictions: Yes RLE Weight Bearing: Non weight bearing      Mobility Bed Mobility Overal bed mobility: Modified Independent Bed Mobility: Supine to Sit           General bed mobility comments: heavy use of bed rails and HOB elevated, increased time and effort, no assistance from therapist    Transfers Overall transfer level: Needs assistance Equipment used: Rolling walker (2 wheeled) Transfers: Sit to/from [employer] Sit to Stand: Min guard Stand pivot transfers: Min assist       General transfer  comment: minguard to stand, therapist stabilizing RW;minA to pivot at RW level;1x minor loss of balance with pivot to transfer onto commode, minA from therapist to stabilize    Balance Overall balance assessment: Needs assistance Sitting-balance support: Feet supported;No upper extremity supported Sitting balance-Leahy Scale: Good Sitting balance - Comments: seated EOB   Standing balance support: Single extremity supported;During functional activity Standing balance-Leahy Scale: Fair Standing balance comment: able to maintain NWB status while standing with single UE supported to complete bathing and grooming;                           ADL either performed or assessed with clinical judgement   ADL Overall ADL's : Needs assistance/impaired Eating/Feeding: Independent;Sitting   Grooming: Min guard;Standing Grooming Details (indicate cue type and reason): pt completed oral care and grooming while standing at sink level, she demonstrated good ability to complete ADL while maintaining NWB status Upper Body Bathing: Set up;Sitting   Lower Body Bathing: Min guard;Sit to/from stand   Upper Body Dressing : Independent;Sitting   Lower Body Dressing: Min guard;Sit to/from stand   Toilet Transfer: Minimal assistance;Stand-pivot;RW Statistician Details (indicate cue type and reason): pt pivoting on LLE and occassionally taking a few hop steps Toileting- Clothing Manipulation and Hygiene: Min guard;Sit to/from stand       Functional mobility during ADLs: Min guard;Minimal assistance;Rolling walker General ADL Comments: minguard for functional mobility with intermittent minA for hop jumps and pivoting;pt demonstrates good safety awareness     Vision         Perception  Praxis      Pertinent Vitals/Pain Pain Assessment: Faces Faces Pain Scale: Hurts even more Pain Location: R LE Pain Descriptors / Indicators: Sharp;Stabbing;Tingling;Cramping;Aching Pain  Intervention(s): Limited activity within patient's tolerance;Monitored during session;Patient requesting pain meds-RN notified     Hand Dominance Right   Extremity/Trunk Assessment Upper Extremity Assessment Upper Extremity Assessment: Overall WFL for tasks assessed   Lower Extremity Assessment Lower Extremity Assessment: RLE deficits/detail RLE Deficits / Details: actively wiggling toes, able to complete SLR ~2 inches off bed without extensor lag;able to maintain NWB status;pt demonstrated decreased knee extension due to increased pain at incision site RLE: Unable to fully assess due to pain RLE Sensation: decreased light touch   Cervical / Trunk Assessment Cervical / Trunk Assessment: Normal   Communication Communication Communication: No difficulties   Cognition Arousal/Alertness: Awake/alert Behavior During Therapy: WFL for tasks assessed/performed Overall Cognitive Status: Within Functional Limits for tasks assessed                                     General Comments  vss;educated pt on fall prevention strategies and safety with mobility in the home;educated pt on how to properly instruct family on how to safely assist her    Exercises General Exercises - Lower Extremity Quad Sets: Supine;Strengthening;Right;5 reps   Shoulder Instructions      Home Living Family/patient expects to be discharged to:: Private residence Living Arrangements: Children Available Help at Discharge: Family;Available 24 hours/day Type of Home: Apartment Home Access: Stairs to enter [employer] of Steps: 1 curb from parking lot to sidewalk, 1 curb from sidewalk to apartment building Entrance Stairs-Rails: None Home Layout: One level     Bathroom Shower/Tub: Chief Strategy Officer: Standard     Home Equipment: Crutches          Prior Functioning/Environment Level of Independence: Independent        Comments: Pt reports independent with ADLs,  drives, ambulates community distances, works in [employer]. Pt reports she was maintaining NWB since fall, she reports she was pivoting on LLE and would use a rolling chair to get around her house.        OT Problem List: Decreased activity tolerance;Impaired balance (sitting and/or standing);Decreased knowledge of precautions;Pain;Obesity      OT Treatment/Interventions: Self-care/ADL training;Therapeutic exercise;DME and/or AE instruction;Therapeutic activities;Patient/family education;Balance training    OT Goals(Current goals can be found in the care plan section) Acute Rehab OT Goals Patient Stated Goal: decrease pain in R leg OT Goal Formulation: With patient Time For Goal Achievement: 05/30/20 Potential to Achieve Goals: Good ADL Goals Pt Will Perform Grooming: with modified independence;standing;sitting Pt Will Perform Lower Body Dressing: with modified independence;sit to/from stand Pt Will Transfer to Toilet: with modified independence;ambulating Additional ADL Goal #1: Pt will demonstrate independence with 3 fall prevention strategies for safe engagement of ADL/IADL and functional mobility.  OT Frequency: Min 2X/week   Barriers to D/C:            Co-evaluation              AM-PAC OT "6 Clicks" Daily Activity     Outcome Measure Help from another person eating meals?: None Help from another person taking care of personal grooming?: A Little Help from another person toileting, which includes using toliet, bedpan, or urinal?: A Little Help from another person bathing (including washing, rinsing, drying)?: A Little Help from another  person to put on and taking off regular upper body clothing?: A Little Help from another person to put on and taking off regular lower body clothing?: A Little 6 Click Score: 19   End of Session Equipment Utilized During Treatment: Rolling walker Nurse Communication: Mobility status;Patient requests pain meds  Activity  Tolerance: Patient tolerated treatment well Patient left: in chair;with call bell/phone within reach  OT Visit Diagnosis: Other abnormalities of gait and mobility (R26.89);History of falling (Z91.81);Pain Pain - Right/Left: Right Pain - part of body: Leg                Time: 0830-0908 OT Time Calculation (min): 38 min Charges:  OT General Charges $OT Visit: 1 Visit OT Evaluation $OT Eval Moderate Complexity: 1 Mod OT Treatments $Self Care/Home Management : 8-22 mins  Rosey Bath OTR/L Acute Rehabilitation Services Office: [phone]   Rebeca Alert 05/16/2020, 10:34 AM

## 2020-05-18 NOTE — Discharge Summary (Cosign Needed)
Discharge Summary  Patient ID: Evelyn Moody MRN: [id number] DOB/AGE: 11-01-1975 45 y.o.  Admit date: 05/14/2020 Discharge date: 05/18/2020  Admission Diagnoses:  Fracture of the right tibia  Discharge Diagnoses:  Active Problems:   Right tibial fracture   Past Medical History:  Diagnosis Date  . Anemia   . History of blood transfusion   . Hypertension   . PONV (postoperative nausea and vomiting)     Surgeries: Procedure(s): OPEN REDUCTION INTERNAL FIXATION (ORIF) TIBIA FRACTURE on 05/14/2020   Consultants (if any): none  Discharged Condition: Improved  Hospital Course: Evelyn Moody is an 45 y.o. female who was admitted 05/14/2020 with a diagnosis of <principal problem not specified> and went to the operating room on 05/14/2020 and underwent the above named procedures.     She was given perioperative antibiotics:  Anti-infectives (From admission, onward)   Start     Dose/Rate Route Frequency Ordered Stop   05/14/20 2000  ceFAZolin (ANCEF) IVPB 2g/100 mL premix        2 g 200 mL/hr over 30 Minutes Intravenous Every 6 hours 05/14/20 1810 05/15/20 0835   05/14/20 1145  ceFAZolin (ANCEF) IVPB 2g/100 mL premix        2 g 200 mL/hr over 30 Minutes Intravenous On call to O.R. 05/14/20 1132 05/14/20 1419    .  She was given sequential compression devices, early ambulation, and aspirin 81mg  bidfor DVT prophylaxis.  She benefited maximally from the hospital stay and there were no complications.    Recent vital signs:  Vitals:   05/15/20 2043 05/16/20 0538  BP: (!) 145/80 (!) 139/91  Pulse: 97 100  Resp: 16 16  Temp: 99.8 F (37.7 C) 99.2 F (37.3 C)  SpO2: 96% 94%    Recent laboratory studies:  Lab Results  Component Value Date   HGB 12.8 05/14/2020   Lab Results  Component Value Date   WBC 6.6 05/14/2020   PLT 325 05/14/2020   No results found for: INR Lab Results  Component Value Date   NA 145 05/14/2020   K 4.2 05/14/2020   CL 106 05/14/2020   CO2 26  05/14/2020   BUN 14 05/14/2020   CREATININE 0.90 05/14/2020   GLUCOSE 97 05/14/2020    Discharge Medications:   Allergies as of 05/16/2020   No Known Allergies     Medication List    STOP taking these medications   ibuprofen 600 MG tablet Commonly known as: ADVIL     TAKE these medications   acetaminophen 500 MG tablet Commonly known as: TYLENOL Take 1,000 mg by mouth every 8 (eight) hours as needed for moderate pain.   amLODipine 10 MG tablet Commonly known as: NORVASC Take 10 mg by mouth daily.   aspirin 81 MG chewable tablet Commonly known as: Aspirin Childrens Chew 1 tablet (81 mg total) by mouth 2 (two) times daily. For DVT prophylaxis after surgery   hydrochlorothiazide 25 MG tablet Commonly known as: HYDRODIURIL Take 25 mg by mouth daily.   ondansetron 4 MG tablet Commonly known as: Zofran Take 1 tablet (4 mg total) by mouth daily as needed for nausea or vomiting.   oxyCODONE 5 MG immediate release tablet Commonly known as: Roxicodone Take 1 tablet (5 mg total) by mouth every 8 (eight) hours as needed for up to 5 days.       Diagnostic Studies: DG Tibia/Fibula Right  Result Date: 05/14/2020 CLINICAL DATA:  Right ankle tibial fracture, midshaft fibular fracture, ORIF EXAM: RIGHT TIBIA AND  FIBULA - 2 VIEW; DG C-ARM 1-60 MIN-NO REPORT COMPARISON:  05/12/2020 FINDINGS: Spot fluoroscopic intraoperative views demonstrate plate screw fixation of the right distal tibia with anatomic alignment. Midshaft fibular fracture with a mildly displaced posterior butterfly fragment noted. Diffuse extremity edema present. IMPRESSION: Status post ORIF of the right ankle distal tibia fracture with anatomic alignment. No complicating feature Midshaft displaced fibular fracture again noted. Electronically Signed   By: Judie Petit.  Shick M.D.   On: 05/14/2020 16:13   CT ANKLE RIGHT WO CONTRAST  Result Date: 05/12/2020 CLINICAL DATA:  Right ankle fracture.  Date of injury 05/06/2020 EXAM: CT OF  THE RIGHT ANKLE WITHOUT CONTRAST TECHNIQUE: Multidetector CT imaging of the right ankle was performed according to the standard protocol. Multiplanar CT image reconstructions were also generated. COMPARISON:  None available FINDINGS: Bones/Joint/Cartilage Hip acute obliquely oriented fracture of the distal tibia extending from the lateral cortex of the distal tibial metadiaphysis to the base of the medial malleolus. There is 6 mm of lateral displacement and minimal posterior displacement at the fracture site. There is an additional obliquely oriented fracture involving the base of the medial malleolus with intra-articular extension to the ankle mortise. Slight medial displacement of the fracture. Small nondisplaced vertically oriented fracture through the posterior malleolus without significant articular surface diastasis or depression (series 8, image 49). Spiral fracture of the mid fibular diaphysis with a 6.0 cm butterfly fragment which is posteriorly displaced with slight posterior angulation. There is approximately 1/2 shaft width of lateral displacement across the fracture site. Lateral malleolus intact without fracture. Mild widening of the medial clear space. No tibiotalar dislocation. Talus and calcaneus intact. Subtalar joints remain aligned. Alignment within the midfoot remains anatomic. Ligaments Suboptimally assessed by CT. Muscles and Tendons Tibialis posterior and flexor digitorum longus tendons closely approximate the fracture site at the posteromedial aspect of the distal tibia. No intramuscular hematoma or other abnormality evident by CT. Soft tissues There is diffuse soft tissue swelling about the lower leg, ankle, and dorsal aspect of the foot. No organized fluid collection or hematoma. IMPRESSION: 1. Acute bimalleolar fracture of the right ankle, as described above. 2. Mild widening of the medial clear space. 3. Mid fibular diaphyseal fracture with mildly displaced and angulated 6.0 cm butterfly  fragment. 4. Tibialis posterior and flexor digitorum longus tendons closely approximate the fracture site at the posteromedial aspect of the distal tibia. Correlate for signs of tendinous entrapment. 5. Diffuse soft tissue swelling about the lower leg, ankle, and dorsal aspect of the foot. No organized fluid collection or hematoma. Electronically Signed   By: Duanne Guess D.O.   On: 05/12/2020 14:37   DG C-Arm 1-60 Min-No Report  Result Date: 05/14/2020 CLINICAL DATA:  Right ankle tibial fracture, midshaft fibular fracture, ORIF EXAM: RIGHT TIBIA AND FIBULA - 2 VIEW; DG C-ARM 1-60 MIN-NO REPORT COMPARISON:  05/12/2020 FINDINGS: Spot fluoroscopic intraoperative views demonstrate plate screw fixation of the right distal tibia with anatomic alignment. Midshaft fibular fracture with a mildly displaced posterior butterfly fragment noted. Diffuse extremity edema present. IMPRESSION: Status post ORIF of the right ankle distal tibia fracture with anatomic alignment. No complicating feature Midshaft displaced fibular fracture again noted. Electronically Signed   By: Judie Petit.  Shick M.D.   On: 05/14/2020 16:13    Disposition: Discharge disposition: 06-Home-Health Care Svc       Discharge Instructions    Call MD / Call 911   Complete by: As directed    If you experience chest pain or shortness of  breath, CALL 911 and be transported to the hospital emergency room.  If you develope a fever above 101 F, pus (white drainage) or increased drainage or redness at the wound, or calf pain, call your surgeon's office.   Diet - low sodium heart healthy   Complete by: As directed    Discharge instructions   Complete by: As directed    It is very important for you to Elevate your leg - Toes above nose as much as possible to reduce pain / swelling. If needed, you may increase pain medication for the first few days post op to 2 tablets every 4 hours.  Weight Bearing:  Non weight bearing on the affected leg.  Diet: As  you were doing prior to hospitalization   Shower:  You have a splint on, leave the splint in place and keep the splint dry with a plastic bag.  Dressing:  You have a splint. Leave the splint in place and we will change your bandages during your first follow-up appointment.  You may loosen and re-apply ace wrap if it feels too tight.    Activity:  Increase activity slowly as tolerated, but follow the weight bearing instructions below.  The rules on driving is that you can not be taking narcotics while you drive, and you must feel in control of the vehicle. If the affected leg is the right leg, you cannot drive while in a splint.   To prevent constipation:  Narcotic medicines cause constipation.  Wean these as soon as is appropriate.   You may use a stool softener such as -  - Colace (over the counter) 100 mg by mouth twice a day  - Drink plenty of fluids (prune juice may be helpful) and high fiber foods - Miralax (over the counter) for constipation as needed  Itching:  If you experience itching with your medications, try taking only a single pain pill, or even half a pain pill at a time.  You can also use benadryl over the counter for itching or also to help with sleep.   Precautions:  If you experience chest pain or shortness of breath - call 911 immediately for transfer to the hospital emergency department!!  If you develop a fever greater that 101 F, purulent drainage from wound, increased redness or drainage from wound, or calf pain -- Call the office at [phone]                                                 Follow- Up Appointment:  Please call for an appointment to be seen in 1-2 weeks by Dr. Eulah Pont at the office- [phone]       Follow-up Information    Sheral Apley, MD In 10 days.   Specialty: Orthopedic Surgery Contact information: [address] [place] [address] [phone]        Care, [facility] Follow up.   Specialty:  Home Health Services Why: For home physical therapy Contact information: [address] [place] [address] [phone]                Signed: Rainey Pines PA-C 05/18/2020, 7:53 AM

## 2020-05-19 NOTE — Op Note (Signed)
05/14/2020  6:40 AM  PATIENT:  Evelyn Moody    PRE-OPERATIVE DIAGNOSIS:  RIGHT TIBIA PILON FRACTURE  POST-OPERATIVE DIAGNOSIS:  Same  PROCEDURE:  OPEN REDUCTION INTERNAL FIXATION (ORIF) TIBIA FRACTURE  SURGEON:  Sheral Apley, MD  ASSISTANT: Daun Peacock, PA-C, he was present and scrubbed throughout the case, critical for completion in a timely fashion, and for retraction, instrumentation, and closure.   ANESTHESIA:   gen  PREOPERATIVE INDICATIONS:  [name] is a  45 y.o. female with a diagnosis of RIGHT TIBIA PILON FRACTURE who failed conservative measures and elected for surgical management.    The risks benefits and alternatives were discussed with the patient preoperatively including but not limited to the risks of infection, bleeding, nerve injury, cardiopulmonary complications, the need for revision surgery, among others, and the patient was willing to proceed.  OPERATIVE IMPLANTS: [employer] AL plate and med plate  OPERATIVE FINDINGS: unstable fracture, stable syndesmosis  BLOOD LOSS: min  COMPLICATIONS: none  TOURNIQUET TIME:  OPERATIVE PROCEDURE:  Patient was identified in the preoperative holding area and site was marked by me She was transported to the operating theater and placed on the table in supine position taking care to pad all bony prominences. After a preincinduction time out anesthesia was induced. The right lower extremity was prepped and draped in normal sterile fashion and a pre-incision timeout was performed. She received ancef for preoperative antibiotics.   Tourniquet was applied inflated to 300 mmHg  I made a longitudinal incision in line with her fourth ray of her foot.  Dissected down to her anterior compartment protecting her superficial peroneal nerve which was identified and mobilized.  Then incised her anterior compartment and identified her fracture site.  I then created a subperiosteal plane across the anterior aspect of the tibia I  was able to reduce her fracture and clamped this into place.  I placed 2 lag screws across her P1 fracture I then placed an anterior lateral locking plate and pinned this into place that took 4 x-rays of the tibia was happy with the alignment of the fracture reduction and placement of the plate placed for screws distally and 3 proximally with excellent purchase on all of these.  Next I thoroughly irrigated this incision and closed it in layers  I turned my attention medially where she had a medial mall fracture made along the longitudinal incision over this and protected her saphenous vein fracture was then mobilized and reduced placed a one third tubular spring plate antiglide plate to prevent superior migration of the medial mall followed by percutaneous ups are a cannulated screw through the medial mouth most essentially performing fixation of the medial mall I then irrigated this incision as well and closed this in layers I took 4 x-rays of the ankle and was happy with fracture reduction and hardware alignment.  POST OPERATIVE PLAN: Nonweightbearing mobilizing chemical DVT prophylaxis

## 2020-05-22 ENCOUNTER — Encounter (HOSPITAL_COMMUNITY): Payer: Self-pay | Admitting: Orthopedic Surgery

## 2020-07-02 ENCOUNTER — Telehealth: Payer: Self-pay | Admitting: *Deleted

## 2020-07-02 NOTE — Telephone Encounter (Signed)
   Evelyn Moody DOB: [birth year] MRN: [id number]   RIDER WAIVER AND RELEASE OF LIABILITY  For purposes of improving physical access to our facilities, [facility] is pleased to partner with third parties to provide [facility] patients or other authorized individuals the option of convenient, on-demand ground transportation services (the [employer]") through use of the technology service that enables users to request on-demand ground transportation from independent third-party providers.  By opting to use and accept these [employer], I, the undersigned, hereby agree on behalf of myself, and on behalf of any minor child using the [employer] for whom I am the parent or legal guardian, as follows:  1. Science writer provided to me are provided by independent third-party transportation providers who are not [employer] or employees and who are unaffiliated with [employer]. 2. [facility] is neither a transportation carrier nor a common or public carrier. 3. [facility] has no control over the quality or safety of the transportation that occurs as a result of the [employer]. 4. [facility] cannot guarantee that any third-party transportation provider will complete any arranged transportation service. 5. [facility] makes no representation, warranty, or guarantee regarding the reliability, timeliness, quality, safety, suitability, or availability of any of the Transport Services or that they will be error free. 6. I fully understand that traveling by vehicle involves risks and dangers of serious bodily injury, including permanent disability, paralysis, and death. I agree, on behalf of myself and on behalf of any minor child using the Transport Services for whom I am the parent or legal guardian, that the entire risk arising out of my use of the [employer] remains solely with me, to the maximum extent permitted under applicable law. 7. The [employer]  are provided "as is" and "as available." [facility] disclaims all representations and warranties, express, implied or statutory, not expressly set out in these terms, including the implied warranties of merchantability and fitness for a particular purpose. 8. I hereby waive and release [facility], its agents, employees, officers, directors, representatives, insurers, attorneys, assigns, successors, subsidiaries, and affiliates from any and all past, present, or future claims, demands, liabilities, actions, causes of action, or suits of any kind directly or indirectly arising from acceptance and use of the [employer]. 9. I further waive and release [facility] and its affiliates from all present and future liability and responsibility for any injury or death to persons or damages to property caused by or related to the use of the [employer]. 10. I have read this Waiver and Release of Liability, and I understand the terms used in it and their legal significance. This Waiver is freely and voluntarily given with the understanding that my right (as well as the right of any minor child for whom I am the parent or legal guardian using the [employer]) to legal recourse against [facility] in connection with the [employer] is knowingly surrendered in return for use of these services.   I attest that I read the consent document to Evelyn Moody, gave Evelyn Moody the opportunity to ask questions and answered the questions asked (if any). I affirm that Evelyn Moody then provided consent for she's participation in this program.     Evelyn Moody

## 2020-07-03 ENCOUNTER — Other Ambulatory Visit: Payer: Self-pay

## 2020-07-03 ENCOUNTER — Ambulatory Visit: Payer: Medicaid Other | Attending: Orthopedic Surgery | Admitting: Physical Therapy

## 2020-07-03 ENCOUNTER — Encounter: Payer: Self-pay | Admitting: Physical Therapy

## 2020-07-03 DIAGNOSIS — M6281 Muscle weakness (generalized): Secondary | ICD-10-CM | POA: Insufficient documentation

## 2020-07-03 DIAGNOSIS — M25571 Pain in right ankle and joints of right foot: Secondary | ICD-10-CM | POA: Diagnosis present

## 2020-07-03 DIAGNOSIS — R2689 Other abnormalities of gait and mobility: Secondary | ICD-10-CM | POA: Insufficient documentation

## 2020-07-03 DIAGNOSIS — Z9889 Other specified postprocedural states: Secondary | ICD-10-CM | POA: Diagnosis present

## 2020-07-03 DIAGNOSIS — R600 Localized edema: Secondary | ICD-10-CM | POA: Insufficient documentation

## 2020-07-03 DIAGNOSIS — Z8781 Personal history of (healed) traumatic fracture: Secondary | ICD-10-CM | POA: Diagnosis present

## 2020-07-03 NOTE — Therapy (Signed)
[facility] [address] [place], [place], [address] Phone: [phone]   Fax:  [phone]  Physical Therapy Evaluation  Patient Details  Name: Evelyn Moody MRN: [id number] Date of Birth: 1975/05/30 Referring Provider (PT): Sheral Apley, MD   Encounter Date: 07/03/2020   PT End of Session - 07/03/20 0848    Visit Number 1    Number of Visits 13    Date for PT Re-Evaluation 08/28/20    Authorization Type Healthy blue MD    PT Start Time 0800    PT Stop Time 0845    PT Time Calculation (min) 45 min    Activity Tolerance Patient tolerated treatment well    Behavior During Therapy [facility] for tasks assessed/performed           Past Medical History:  Diagnosis Date  . Anemia   . History of blood transfusion   . Hypertension   . PONV (postoperative nausea and vomiting)     Past Surgical History:  Procedure Laterality Date  . ABDOMINAL HYSTERECTOMY    . CESAREAN SECTION    . ORIF TIBIA FRACTURE Right 05/14/2020   Procedure: OPEN REDUCTION INTERNAL FIXATION (ORIF) TIBIA FRACTURE;  Surgeon: Sheral Apley, MD;  Location: [facility];  Service: Orthopedics;  Laterality: Right;    There were no vitals filed for this visit.    Subjective Assessment - 07/03/20 0811    Subjective pt is a 45 y.o F s/p R tibia ORIF on 05/14/20 secondary to a slip. she currently is using boot and 1 crutch but is carrying the crutch. no pain today, and reports some weird feeling with the weather. She denies any N/T but does report swelling in the top of the foot.    How long can you sit comfortably? unlimited    How long can you stand comfortably? 30-60 min    How long can you walk comfortably? unlimited    Diagnostic tests CT 05/12/2020 IMPRESSION:  1. Acute bimalleolar fracture of the right ankle, as described  above.  2. Mild widening of the medial clear space.  3. Mid fibular diaphyseal fracture with mildly displaced and  angulated 6.0 cm butterfly  fragment.  4. Tibialis posterior and flexor digitorum longus tendons closely  approximate the fracture site at the posteromedial aspect of the  distal tibia. Correlate for signs of tendinous entrapment.  5. Diffuse soft tissue swelling about the lower leg, ankle, and  dorsal aspect of the foot. No organized fluid collection or  hematoma.    Patient Stated Goals get back to work,    Currently in Pain? Yes    Pain Score 0-No pain    Pain Location Ankle    Pain Orientation Right    Pain Type Chronic pain    Pain Onset More than a month ago    Pain Frequency Intermittent    Aggravating Factors  reports swelling in the top    Pain Relieving Factors N/A    Effect of Pain on Daily Activities caused low back pain due to the boot.              [facility] PT Assessment - 07/03/20 0001      Assessment   Medical Diagnosis ORIF on disal the tibia    Referring Provider (PT) Sheral Apley, MD    Onset Date/Surgical Date 05/14/20    Hand Dominance Right    Next MD Visit 08/05/2020    Prior Therapy no  Precautions   Precautions None      Restrictions   Weight Bearing Restrictions No      Balance Screen   Has the patient fallen in the past 6 months Yes    How many times? 1    Has the patient had a decrease in activity level because of a fear of falling?  No    Is the patient reluctant to leave their home because of a fear of falling?  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 / walking, lifting, carrying, pushing/ pulling      Cognition   Overall Cognitive Status  Within Functional Limits for tasks assessed      Observation/Other Assessments-Edema    Edema Figure 8      Figure 8 Edema   Figure 8 - Right  59.5    Figure 8 - Left  54.5      Posture/Postural Control   Posture/Postural Control Postural limitations      ROM / Strength   AROM / PROM / Strength AROM;Strength;PROM      AROM   AROM Assessment Site Ankle    Right/Left Ankle Right;Left    Right Ankle Dorsiflexion -10    Right Ankle Plantar Flexion 35    Right Ankle Inversion 10    Right Ankle Eversion 5    Left Ankle Dorsiflexion 8    Left Ankle Plantar Flexion 65    Left Ankle Inversion 25    Left Ankle Eversion 15      PROM   PROM Assessment Site Ankle    Right/Left Ankle Right    Right Ankle Dorsiflexion -5    Right Ankle Plantar Flexion 40    Right Ankle Inversion 16    Right Ankle Eversion 10      Strength   Strength Assessment Site Ankle    Right/Left Ankle Right;Left    Right Ankle Dorsiflexion 4/5    Right Ankle Plantar Flexion 4-/5    Right Ankle Inversion 4/5    Left Ankle Dorsiflexion 4+/5    Left Ankle Plantar Flexion 4+/5    Left Ankle Inversion 3+/5    Left Ankle Eversion 4-/5      Palpation   Palpation comment Tenderness only along the incision                      Objective measurements completed on examination: See above findings.               PT Education - 07/03/20 0837    Education Details evaluation fidings, POC, goals, HEP, reivewed gait pattern with 1 crutch and proper form    Person(s) Educated Patient    Methods Explanation;Verbal cues;Handout    Comprehension Verbalized understanding            PT Short Term Goals - 07/03/20 0930      PT SHORT TERM GOAL #1   Title pt to be IND with inital HEP  Baseline no previous HEp    Time 3    Period Weeks    Status New    Target Date 07/24/20      PT SHORT TERM GOAL #2   Title reduce R ankle edema by >/= 1cm to promote ankle ROM    Baseline see flow sheet     Time 3    Period Weeks    Status New    Target Date 07/24/20      PT SHORT TERM GOAL #3   Title increase ankle DF to >/= 0 degrees (neutral) for improvement in functional mobility    Baseline -10 degrees DF AROM    Time 3    Period Weeks    Status New    Target Date 07/24/20             PT Long Term Goals - 07/03/20 0931      PT LONG TERM GOAL #1   Title increaes ankle DF >/=6 degrees and PF to >/= 50 degrees to promtoe ROM required for gait efficency    Baseline see flow sheet    Time 6    Period Weeks    Status New    Target Date 08/28/20      PT LONG TERM GOAL #2   Title increaes gross R ankle strenght to >/= 4+/5 to promote ankle stability with standing/ walking    Baseline see flow sheet    Time 6    Period Weeks    Status New    Target Date 08/28/20      PT LONG TERM GOAL #3   Title pt to walk for >/= 60 min demonstrating efficent gait pattern with no AD for endurance required for work related activities.    Baseline walks with camboot, and 1 crutch    Time 6    Period Weeks    Status New    Target Date 08/28/20      PT LONG TERM GOAL #4   Title pt to be IND with all HEP and is able to maintain and progress current LOF IND    Baseline no previous HEP    Time 6    Period Weeks    Status New    Target Date 08/28/20                  Plan - 07/03/20 0848    Clinical Impression Statement pt is a pleasant 45 y.o F with s/p R distal tibia ORIF on 05/14/2020. She demonstates limited ankle ROM in all planes with associated weakness as well as sweling which is to be expected following surgery She exhibits antalgic gait pattern with limited stride with toe out pattern on the R using a cam boot carrying her crutch. She would benefit from physical therapy to reduce ankle sweling, improve ROM/ strength, promote gait efficiency and endurnace and maximize her function by addressing the defictis listed.    Stability/Clinical Decision Making Stable/Uncomplicated     Clinical Decision Making Low    Rehab Potential Good    PT Frequency 2x / week    PT Duration 6 weeks    PT Treatment/Interventions ADLs/Self Care Home Management;Cryotherapy;Moist Heat;Ultrasound;Gait training;Stair training;Functional mobility training;Therapeutic activities;Therapeutic exercise;Balance training;Neuromuscular re-education;Patient/family education;Manual techniques;Passive range of motion;Scar mobilization;Taping    PT Next Visit Plan review/ update HEP PRN, (no e-stim, VASo per INS) ankle mobs, gross ankle strengthening, gait training.    PT Home Exercise Plan [id number] - ankle abc (insupine), towe inversion/eversion,  seated heel/ toe raise, standing calf stretch, forward weight shifting    Consulted and Agree with Plan of Care Patient           Patient will benefit from skilled therapeutic intervention in order to improve the following deficits and impairments:  Improper body mechanics,Increased muscle spasms,Decreased strength,Abnormal gait,Obesity,Decreased activity tolerance,Decreased endurance,Decreased range of motion,Postural dysfunction  Visit Diagnosis: S/P ORIF (open reduction internal fixation) fracture  Pain in right ankle and joints of right foot  Localized edema  Muscle weakness (generalized)  Other abnormalities of gait and mobility     Problem List Patient Active Problem List   Diagnosis Date Noted  . Right tibial fracture 05/14/2020    Lulu RidingKristoffer Mattisen Pohlmann PT, DPT, LAT, ATC  07/03/20  10:10 AM      [facility] [address] [place], [place], [address] Phone: [phone]   Fax:  [phone]  Name: Carolann LittlerCherise Cordrey MRN: [id number] Date of Birth: 12/12/1975     Check all possible CPT codes: 97110- Therapeutic Exercise, [id number]- Neuro Re-education, [phone] - Gait Training, [phone] - Manual Therapy, [phone] - Therapeutic Activities, [phone] - Self Care, [phone] - Ultrasound and [id number] - Physical  performance training

## 2020-07-08 ENCOUNTER — Ambulatory Visit: Payer: Medicaid Other | Attending: Orthopedic Surgery | Admitting: Rehabilitative and Restorative Service Providers"

## 2020-07-08 ENCOUNTER — Other Ambulatory Visit: Payer: Self-pay

## 2020-07-08 ENCOUNTER — Encounter: Payer: Self-pay | Admitting: Rehabilitative and Restorative Service Providers"

## 2020-07-08 DIAGNOSIS — Z8781 Personal history of (healed) traumatic fracture: Secondary | ICD-10-CM | POA: Diagnosis present

## 2020-07-08 DIAGNOSIS — R600 Localized edema: Secondary | ICD-10-CM | POA: Insufficient documentation

## 2020-07-08 DIAGNOSIS — Z9889 Other specified postprocedural states: Secondary | ICD-10-CM | POA: Diagnosis present

## 2020-07-08 DIAGNOSIS — M6281 Muscle weakness (generalized): Secondary | ICD-10-CM | POA: Diagnosis present

## 2020-07-08 DIAGNOSIS — R2689 Other abnormalities of gait and mobility: Secondary | ICD-10-CM | POA: Diagnosis present

## 2020-07-08 DIAGNOSIS — M25571 Pain in right ankle and joints of right foot: Secondary | ICD-10-CM | POA: Diagnosis present

## 2020-07-08 NOTE — Patient Instructions (Signed)
Advised pt that since she wore a shoe for 5 hours for the first time of her PT treatment day, she may experience soreness and to continue to ice x 10 min as needed and do HEP. Pt agreed.

## 2020-07-08 NOTE — Therapy (Signed)
[facility] [address] [place], [place], [address] Phone: [phone]   Fax:  [phone]  Physical Therapy Treatment  Patient Details  Name: Evelyn Moody MRN: [id number] Date of Birth: 1976/03/27 Referring Provider (PT): Sheral Apley, MD   Encounter Date: 07/08/2020   PT End of Session - 07/08/20 1559    Visit Number 2    PT Start Time 0218    PT Stop Time 0315    PT Time Calculation (min) 57 min           Past Medical History:  Diagnosis Date  . Anemia   . History of blood transfusion   . Hypertension   . PONV (postoperative nausea and vomiting)     Past Surgical History:  Procedure Laterality Date  . ABDOMINAL HYSTERECTOMY    . CESAREAN SECTION    . ORIF TIBIA FRACTURE Right 05/14/2020   Procedure: OPEN REDUCTION INTERNAL FIXATION (ORIF) TIBIA FRACTURE;  Surgeon: Sheral Apley, MD;  Location: [facility];  Service: Orthopedics;  Laterality: Right;    There were no vitals filed for this visit.   Subjective Assessment - 07/08/20 1420    Subjective I actually wore my shoes today about 5 hours today and only put on my boot on to come in here. Dr had said I could come out of my boot at any time.    Currently in Pain? No/denies    Pain Score 0-No pain                             [facility] Adult PT Treatment/Exercise - 07/08/20 0001      Exercises   Exercises Ankle      Modalities   Modalities Cryotherapy      Cryotherapy   Number Minutes Cryotherapy 10 Minutes    Cryotherapy Location Ankle   right   Type of Cryotherapy Ice pack      Manual Therapy   Manual therapy comments PROM all planes with and without isometric static hold each direction; isometrics for PF/DF/inversion/eversion x 10 with 5 sec hold each; grade 2-3 PA/AP talocrural joint mobs to pt tolerance      Ankle Exercises: Seated   Other Seated Ankle Exercises seated towel ankle inversion/eversion with PT verbal and visual cues  for technique x 20; seated heel/toe raise x 20, seated toe raise isometric x 10 with 5 sec hold.      Ankle Exercises: Supine   Other Supine Ankle Exercises ankle abc's x 1 set; ankle circles CW/CCW x 20 each with PT vc's to not compensate and use entire leg. ankle pump x 10, ankle inversion/eversion AROM x 10, toe flexion/extension splay x 15; quad sets x 20; SLR x 10 with ankle DF                  PT Education - 07/08/20 1430    Education Details reviewed forward weightshift HEP with pt stating understanding    Person(s) Educated Patient    Methods Explanation;Demonstration;Verbal cues    Comprehension Verbalized understanding            PT Short Term Goals - 07/08/20 1604      PT SHORT TERM GOAL #1   Title pt to be IND with inital HEP    Status On-going      PT SHORT TERM GOAL #2   Title reduce R ankle edema by >/= 1cm to promote ankle ROM  Status On-going      PT SHORT TERM GOAL #3   Title increase ankle DF to >/= 0 degrees (neutral) for improvement in functional mobility    Status On-going             PT Long Term Goals - 07/03/20 0931      PT LONG TERM GOAL #1   Title increaes ankle DF >/=6 degrees and PF to >/= 50 degrees to promtoe ROM required for gait efficency    Baseline see flow sheet    Time 6    Period Weeks    Status New    Target Date 08/28/20      PT LONG TERM GOAL #2   Title increaes gross R ankle strenght to >/= 4+/5 to promote ankle stability with standing/ walking    Baseline see flow sheet    Time 6    Period Weeks    Status New    Target Date 08/28/20      PT LONG TERM GOAL #3   Title pt to walk for >/= 60 min demonstrating efficent gait pattern with no AD for endurance required for work related activities.    Baseline walks with camboot, and 1 crutch    Time 6    Period Weeks    Status New    Target Date 08/28/20      PT LONG TERM GOAL #4   Title pt to be IND with all HEP and is able to maintain and progress current LOF  IND    Baseline no previous HEP    Time 6    Period Weeks    Status New    Target Date 08/28/20                 Plan - 07/08/20 1602    Clinical Impression Statement Pt presents to Pt s/p R distal tibia ORIF 05/14/2020. She demos limited ankle ROM all planes with weakness. She does not have swelling evident this visit. She arrived to PT carrying her crutch but with her CAM boot donned. She would benefit from PT for further improvement in ROM/strength and gait training to improve her functional mobility without pain. Pt had no pain at treatment end. Pt advised she may be sore after treatment and due to wearing a shoe for the first time today for 5 hours. Pt needed maximal verbal cues to perform all exercises correctly without compensation.    PT Treatment/Interventions ADLs/Self Care Home Management;Cryotherapy;Moist Heat;Ultrasound;Gait training;Stair training;Functional mobility training;Therapeutic activities;Therapeutic exercise;Balance training;Neuromuscular re-education;Patient/family education;Manual techniques;Passive range of motion;Scar mobilization;Taping    PT Next Visit Plan (no e-stim, VASo per INS) ankle mobs, gross ankle strengthening, gait training.    Consulted and Agree with Plan of Care Patient           Patient will benefit from skilled therapeutic intervention in order to improve the following deficits and impairments:  Improper body mechanics,Increased muscle spasms,Decreased strength,Abnormal gait,Obesity,Decreased activity tolerance,Decreased endurance,Decreased range of motion,Postural dysfunction  Visit Diagnosis: S/P ORIF (open reduction internal fixation) fracture  Pain in right ankle and joints of right foot  Localized edema  Muscle weakness (generalized)  Other abnormalities of gait and mobility     Problem List Patient Active Problem List   Diagnosis Date Noted  . Right tibial fracture 05/14/2020    Thornell Sartorius, PT 07/08/2020, 4:06  PM  [facility] [address] [place], [place], [address] Phone: [phone]   Fax:  [phone]  Name:  Evelyn Moody MRN: [id number] Date of Birth: 06-27-1975

## 2020-07-10 ENCOUNTER — Ambulatory Visit: Payer: Medicaid Other

## 2020-07-10 ENCOUNTER — Other Ambulatory Visit: Payer: Self-pay

## 2020-07-10 DIAGNOSIS — Z9889 Other specified postprocedural states: Secondary | ICD-10-CM | POA: Diagnosis not present

## 2020-07-10 DIAGNOSIS — R600 Localized edema: Secondary | ICD-10-CM

## 2020-07-10 DIAGNOSIS — M6281 Muscle weakness (generalized): Secondary | ICD-10-CM

## 2020-07-10 DIAGNOSIS — R2689 Other abnormalities of gait and mobility: Secondary | ICD-10-CM

## 2020-07-10 DIAGNOSIS — M25571 Pain in right ankle and joints of right foot: Secondary | ICD-10-CM

## 2020-07-10 DIAGNOSIS — Z8781 Personal history of (healed) traumatic fracture: Secondary | ICD-10-CM

## 2020-07-10 NOTE — Therapy (Signed)
[facility] [address] [place], [place], [address] Phone: [phone]   Fax:  [phone]  Physical Therapy Treatment  Patient Details  Name: Evelyn Moody MRN: [id number] Date of Birth: 04/01/1976 Referring Provider (PT): Sheral Apley, MD   Encounter Date: 07/10/2020   PT End of Session - 07/10/20 1325    Visit Number 3    Number of Visits 13    Date for PT Re-Evaluation 08/28/20    Authorization Type Healthy blue MD    PT Start Time 1318    PT Stop Time 1401    PT Time Calculation (min) 43 min    Activity Tolerance Patient tolerated treatment well    Behavior During Therapy [facility] for tasks assessed/performed           Past Medical History:  Diagnosis Date  . Anemia   . History of blood transfusion   . Hypertension   . PONV (postoperative nausea and vomiting)     Past Surgical History:  Procedure Laterality Date  . ABDOMINAL HYSTERECTOMY    . CESAREAN SECTION    . ORIF TIBIA FRACTURE Right 05/14/2020   Procedure: OPEN REDUCTION INTERNAL FIXATION (ORIF) TIBIA FRACTURE;  Surgeon: Sheral Apley, MD;  Location: [facility];  Service: Orthopedics;  Laterality: Right;    There were no vitals filed for this visit.   Subjective Assessment - 07/10/20 1322    Subjective Pt reports she was sore yesterday. She is back in her boot now, but goes back to work 3/31.    Currently in Pain? Yes    Pain Score 2     Pain Location Ankle    Pain Orientation Right              [facility] PT Assessment - 07/10/20 0001      AROM   Right Ankle Dorsiflexion -8   to -6 post manual                        [facility] Adult PT Treatment/Exercise - 07/10/20 0001      Manual Therapy   Manual Therapy Joint mobilization    Manual therapy comments PROM all planes    Joint Mobilization grade 3 AP TCJ mobs      Ankle Exercises: Stretches   Other Stretch R outer hip stretching with mid shin crossed over L knee      Ankle  Exercises: Supine   Other Supine Ankle Exercises ankle abc's x 1 set; ankle circles CW/CCW x 20 each with PT vc's to not compensate and use entire leg. ankle pump x 10, ankle inversion/eversion AROM x 10, toe flexion/extension splay x 15; quad sets 10 x 5"; SLR x 10 with ankle DF      Ankle Exercises: Seated   Other Seated Ankle Exercises seated towel ankle inversion/eversion with PT verbal and visual cues for technique x 20; seated heel/toe raise x 20, seated toe raise isometric x 10 with 5 sec hold.                  PT Education - 07/10/20 1414    Education Details continue to perform HEP, focus on neutral hip and R LE, soreness expected, ice    Person(s) Educated Patient    Methods Explanation;Demonstration;Verbal cues;Tactile cues    Comprehension Verbalized understanding;Returned demonstration            PT Short Term Goals - 07/08/20 1604  PT SHORT TERM GOAL #1   Title pt to be IND with inital HEP    Status On-going      PT SHORT TERM GOAL #2   Title reduce R ankle edema by >/= 1cm to promote ankle ROM    Status On-going      PT SHORT TERM GOAL #3   Title increase ankle DF to >/= 0 degrees (neutral) for improvement in functional mobility    Status On-going             PT Long Term Goals - 07/03/20 0931      PT LONG TERM GOAL #1   Title increaes ankle DF >/=6 degrees and PF to >/= 50 degrees to promtoe ROM required for gait efficency    Baseline see flow sheet    Time 6    Period Weeks    Status New    Target Date 08/28/20      PT LONG TERM GOAL #2   Title increaes gross R ankle strenght to >/= 4+/5 to promote ankle stability with standing/ walking    Baseline see flow sheet    Time 6    Period Weeks    Status New    Target Date 08/28/20      PT LONG TERM GOAL #3   Title pt to walk for >/= 60 min demonstrating efficent gait pattern with no AD for endurance required for work related activities.    Baseline walks with camboot, and 1 crutch     Time 6    Period Weeks    Status New    Target Date 08/28/20      PT LONG TERM GOAL #4   Title pt to be IND with all HEP and is able to maintain and progress current LOF IND    Baseline no previous HEP    Time 6    Period Weeks    Status New    Target Date 08/28/20                 Plan - 07/10/20 1325    Clinical Impression Statement Pt presents with mild c/o soreness 1-2/10. R lateral hip and LE tightness limited ankle neutral - pt c/o significant stretch with introduction of outer hip stretching. Pt does demo increased resting tibial ER as well. TTP in medial ankle/foot posterior and inferior to medial malleoli. She has difficulty not compensating with LE while inverting R foot/ankle, reporting muscle soreness along superior posterior tibialis.    PT Treatment/Interventions ADLs/Self Care Home Management;Cryotherapy;Moist Heat;Ultrasound;Gait training;Stair training;Functional mobility training;Therapeutic activities;Therapeutic exercise;Balance training;Neuromuscular re-education;Patient/family education;Manual techniques;Passive range of motion;Scar mobilization;Taping    PT Next Visit Plan (no e-stim, VASo per INS) ankle mobs, gross ankle strengthening, gait training.    Consulted and Agree with Plan of Care Patient           Patient will benefit from skilled therapeutic intervention in order to improve the following deficits and impairments:  Improper body mechanics,Increased muscle spasms,Decreased strength,Abnormal gait,Obesity,Decreased activity tolerance,Decreased endurance,Decreased range of motion,Postural dysfunction  Visit Diagnosis: S/P ORIF (open reduction internal fixation) fracture  Pain in right ankle and joints of right foot  Localized edema  Muscle weakness (generalized)  Other abnormalities of gait and mobility     Problem List Patient Active Problem List   Diagnosis Date Noted  . Right tibial fracture 05/14/2020    Marcelline Mates, PT,  DPT 07/10/2020, 3:03 PM  [facility] [address]  [address] [place], [place], [address] Phone: [phone]   Fax:  [phone]  Name: Evelyn Moody MRN: [id number] Date of Birth: 02-06-1976

## 2020-07-14 ENCOUNTER — Ambulatory Visit: Payer: Medicaid Other | Admitting: Physical Therapy

## 2020-07-14 ENCOUNTER — Other Ambulatory Visit: Payer: Self-pay

## 2020-07-14 DIAGNOSIS — M6281 Muscle weakness (generalized): Secondary | ICD-10-CM

## 2020-07-14 DIAGNOSIS — M25571 Pain in right ankle and joints of right foot: Secondary | ICD-10-CM

## 2020-07-14 DIAGNOSIS — R600 Localized edema: Secondary | ICD-10-CM

## 2020-07-14 DIAGNOSIS — Z9889 Other specified postprocedural states: Secondary | ICD-10-CM | POA: Diagnosis not present

## 2020-07-14 DIAGNOSIS — R2689 Other abnormalities of gait and mobility: Secondary | ICD-10-CM

## 2020-07-14 DIAGNOSIS — Z8781 Personal history of (healed) traumatic fracture: Secondary | ICD-10-CM

## 2020-07-14 NOTE — Therapy (Signed)
[facility] [address] [place], [place], [address] Phone: [phone]   Fax:  [phone]  Physical Therapy Treatment  Patient Details  Name: Evelyn Moody MRN: [id number] Date of Birth: Apr 28, 1976 Referring Provider (PT): Sheral Apley, MD   Encounter Date: 07/14/2020   PT End of Session - 07/14/20 1506    Visit Number 4    Number of Visits 13    Date for PT Re-Evaluation 08/28/20    Authorization Type [employer] blue MD    PT Start Time 1501    PT Stop Time 1540    PT Time Calculation (min) 39 min    Activity Tolerance Patient tolerated treatment well    Behavior During Therapy [facility] for tasks assessed/performed           Past Medical History:  Diagnosis Date  . Anemia   . History of blood transfusion   . Hypertension   . PONV (postoperative nausea and vomiting)     Past Surgical History:  Procedure Laterality Date  . ABDOMINAL HYSTERECTOMY    . CESAREAN SECTION    . ORIF TIBIA FRACTURE Right 05/14/2020   Procedure: OPEN REDUCTION INTERNAL FIXATION (ORIF) TIBIA FRACTURE;  Surgeon: Sheral Apley, MD;  Location: [facility];  Service: Orthopedics;  Laterality: Right;    There were no vitals filed for this visit.   Subjective Assessment - 07/14/20 1507    Subjective " I am no pain I am just feeling itchy."    Diagnostic tests CT 05/12/2020 IMPRESSION:  1. Acute bimalleolar fracture of the right ankle, as described  above.  2. Mild widening of the medial clear space.  3. Mid fibular diaphyseal fracture with mildly displaced and  angulated 6.0 cm butterfly fragment.  4. Tibialis posterior and flexor digitorum longus tendons closely  approximate the fracture site at the posteromedial aspect of the  distal tibia. Correlate for signs of tendinous entrapment.  5. Diffuse soft tissue swelling about the lower leg, ankle, and  dorsal aspect of the foot. No organized fluid collection or  hematoma.    Currently in Pain? Yes    Pain  Score 0-No pain    Pain Location Ankle    Aggravating Factors  Weather    Pain Relieving Factors N/A              [facility] PT Assessment - 07/14/20 0001      AROM   Right Ankle Dorsiflexion 2    Right Ankle Plantar Flexion 38                         [facility] Adult PT Treatment/Exercise - 07/14/20 0001      Manual Therapy   Joint Mobilization grade III PA  TCJ to promote PF      Ankle Exercises: Stretches   Gastroc Stretch 2 reps;30 seconds   with stap     Ankle Exercises: Seated   BAPS Level 2;Sitting;10 reps   DF/PF, inversion / Eversion - increaesd difficulty with inversion/ eversion requiring min A to promote ROM, eventually halted Inversion/ eversion.   Other Seated Ankle Exercises seated towel ankle inversion/eversion with PT verbal and visual cues for technique x 20; seated heel/toe raise x 20, seated toe raise isometric x 10 with 5 sec hold.      Ankle Exercises: Standing   Other Standing Ankle Exercises forward/ weight shfiting alternating lead and trail leg after 10 reps   DF lead foot with  posterior trunk lean, and PF on trail leg with forward trunk lean.     Ankle Exercises: Supine   T-Band red theraband 4-way ankle strength 1 x 15                  PT Education - 07/14/20 1534    Education Details Reviewed HEP and discussed benefit of doing forward rocking exercise to promote pre-gait biomechanics    Person(s) Educated Patient    Methods Explanation;Verbal cues;Handout    Comprehension Verbalized understanding;Verbal cues required            PT Short Term Goals - 07/08/20 1604      PT SHORT TERM GOAL #1   Title pt to be IND with inital HEP    Status On-going      PT SHORT TERM GOAL #2   Title reduce R ankle edema by >/= 1cm to promote ankle ROM    Status On-going      PT SHORT TERM GOAL #3   Title increase ankle DF to >/= 0 degrees (neutral) for improvement in functional mobility    Status On-going             PT Long Term  Goals - 07/03/20 0931      PT LONG TERM GOAL #1   Title increaes ankle DF >/=6 degrees and PF to >/= 50 degrees to promtoe ROM required for gait efficency    Baseline see flow sheet    Time 6    Period Weeks    Status New    Target Date 08/28/20      PT LONG TERM GOAL #2   Title increaes gross R ankle strenght to >/= 4+/5 to promote ankle stability with standing/ walking    Baseline see flow sheet    Time 6    Period Weeks    Status New    Target Date 08/28/20      PT LONG TERM GOAL #3   Title pt to walk for >/= 60 min demonstrating efficent gait pattern with no AD for endurance required for work related activities.    Baseline walks with camboot, and 1 crutch    Time 6    Period Weeks    Status New    Target Date 08/28/20      PT LONG TERM GOAL #4   Title pt to be IND with all HEP and is able to maintain and progress current LOF IND    Baseline no previous HEP    Time 6    Period Weeks    Status New    Target Date 08/28/20                 Plan - 07/14/20 1539    Clinical Impression Statement pt reports no pain coming today but continues to exhibit antalgic gait pattern with utilizing cam boot and 1 crutch. continued working on ankle ROM which she is making progress with DF increasing to 3 degrees. but continues to lack PF/DF. she did well with ankle strengthening but demonstrates weakness secondary to limited AROM. practiced pre-gait activities with rocking forward/ back with RLE trailing focusing on PF with forward rocking. Reviewed HEp and discussed continued practiced at home.    PT Treatment/Interventions ADLs/Self Care Home Management;Cryotherapy;Moist Heat;Ultrasound;Gait training;Stair training;Functional mobility training;Therapeutic activities;Therapeutic exercise;Balance training;Neuromuscular re-education;Patient/family education;Manual techniques;Passive range of motion;Scar mobilization;Taping    PT Next Visit Plan (no e-stim, VASo per INS) ankle mobs,  gross ankle strengthening, gait training.  bring her other shoe in to practice with shoe on vs boot.    PT Home Exercise Plan [id number] - ankle abc (insupine), towe inversion/eversion, seated heel/ toe raise, standing calf stretch, forward weight shifting    Consulted and Agree with Plan of Care Patient           Patient will benefit from skilled therapeutic intervention in order to improve the following deficits and impairments:  Improper body mechanics,Increased muscle spasms,Decreased strength,Abnormal gait,Obesity,Decreased activity tolerance,Decreased endurance,Decreased range of motion,Postural dysfunction  Visit Diagnosis: Pain in right ankle and joints of right foot  Localized edema  S/P ORIF (open reduction internal fixation) fracture  Muscle weakness (generalized)  Other abnormalities of gait and mobility     Problem List Patient Active Problem List   Diagnosis Date Noted  . Right tibial fracture 05/14/2020    Lulu Riding PT, DPT, LAT, ATC  07/14/20  3:44 PM      [facility] [address] [place], [place], [address] Phone: [phone]   Fax:  [phone]  Name: Christle Nolting MRN: [id number] Date of Birth: [birth year]

## 2020-07-16 ENCOUNTER — Encounter: Payer: Self-pay | Admitting: Physical Therapy

## 2020-07-16 ENCOUNTER — Ambulatory Visit: Payer: Medicaid Other | Admitting: Physical Therapy

## 2020-07-16 ENCOUNTER — Other Ambulatory Visit: Payer: Self-pay

## 2020-07-16 DIAGNOSIS — R2689 Other abnormalities of gait and mobility: Secondary | ICD-10-CM

## 2020-07-16 DIAGNOSIS — M6281 Muscle weakness (generalized): Secondary | ICD-10-CM

## 2020-07-16 DIAGNOSIS — M25571 Pain in right ankle and joints of right foot: Secondary | ICD-10-CM

## 2020-07-16 DIAGNOSIS — Z8781 Personal history of (healed) traumatic fracture: Secondary | ICD-10-CM

## 2020-07-16 DIAGNOSIS — R600 Localized edema: Secondary | ICD-10-CM

## 2020-07-16 DIAGNOSIS — Z9889 Other specified postprocedural states: Secondary | ICD-10-CM | POA: Diagnosis not present

## 2020-07-16 NOTE — Therapy (Signed)
[facility] [address] [place], [place], [address] Phone: [phone]   Fax:  [phone]  Physical Therapy Treatment  Patient Details  Name: Evelyn Moody MRN: [id number] Date of Birth: 1975-06-05 Referring Provider (PT): Sheral Apley, MD   Encounter Date: 07/16/2020   PT End of Session - 07/16/20 0854    Visit Number 5    Number of Visits 13    Date for PT Re-Evaluation 08/28/20    Authorization Type [employer] blue MD- Berkley Harvey pending    PT Start Time 0845    PT Stop Time 0930    PT Time Calculation (min) 45 min           Past Medical History:  Diagnosis Date  . Anemia   . History of blood transfusion   . Hypertension   . PONV (postoperative nausea and vomiting)     Past Surgical History:  Procedure Laterality Date  . ABDOMINAL HYSTERECTOMY    . CESAREAN SECTION    . ORIF TIBIA FRACTURE Right 05/14/2020   Procedure: OPEN REDUCTION INTERNAL FIXATION (ORIF) TIBIA FRACTURE;  Surgeon: Sheral Apley, MD;  Location: [facility];  Service: Orthopedics;  Laterality: Right;    There were no vitals filed for this visit.   Subjective Assessment - 07/16/20 0848    Subjective No pain. I only wear my boot when I leave the house. I do not use the crutch at home much either. MD said I could get rid of the Boot. MD wanted to send me back to work on March 3rd but I did not think I was ready.    Diagnostic tests CT 05/12/2020 IMPRESSION:  1. Acute bimalleolar fracture of the right ankle, as described  above.  2. Mild widening of the medial clear space.  3. Mid fibular diaphyseal fracture with mildly displaced and  angulated 6.0 cm butterfly fragment.  4. Tibialis posterior and flexor digitorum longus tendons closely  approximate the fracture site at the posteromedial aspect of the  distal tibia. Correlate for signs of tendinous entrapment.  5. Diffuse soft tissue swelling about the lower leg, ankle, and  dorsal aspect of the foot. No  organized fluid collection or  hematoma.    Pain Score 0-No pain    Pain Orientation --    Pain Type --                             [facility] Adult PT Treatment/Exercise - 07/16/20 0001      Ambulation/Gait   Pre-Gait Activities forward weight shifting per new HEP      Manual Therapy   Manual therapy comments --      Ankle Exercises: Stretches   Gastroc Stretch 2 reps;30 seconds   with towel   Other Stretch standing runners stretch at back of chair      Ankle Exercises: Seated   Towel Crunch 5 reps    Other Seated Ankle Exercises seated towel ankle inversion/eversion with PT verbal and visual cues for technique x 20; seated heel/toe raise x 20      Ankle Exercises: Supine   T-Band red theraband 4-way ankle strength 1 x 15      Ankle Exercises: Aerobic   Recumbent Bike 5 minute full revolutons for ROM      Ankle Exercises: Standing   Other Standing Ankle Exercises forward/ weight shfiting alternating lead and trail leg after 10 reps   DF lead foot  with posterior trunk lean, and PF on trail leg with forward trunk lean.                   PT Short Term Goals - 07/08/20 1604      PT SHORT TERM GOAL #1   Title pt to be IND with inital HEP    Status On-going      PT SHORT TERM GOAL #2   Title reduce R ankle edema by >/= 1cm to promote ankle ROM    Status On-going      PT SHORT TERM GOAL #3   Title increase ankle DF to >/= 0 degrees (neutral) for improvement in functional mobility    Status On-going             PT Long Term Goals - 07/03/20 0931      PT LONG TERM GOAL #1   Title increaes ankle DF >/=6 degrees and PF to >/= 50 degrees to promtoe ROM required for gait efficency    Baseline see flow sheet    Time 6    Period Weeks    Status New    Target Date 08/28/20      PT LONG TERM GOAL #2   Title increaes gross R ankle strenght to >/= 4+/5 to promote ankle stability with standing/ walking    Baseline see flow sheet    Time 6     Period Weeks    Status New    Target Date 08/28/20      PT LONG TERM GOAL #3   Title pt to walk for >/= 60 min demonstrating efficent gait pattern with no AD for endurance required for work related activities.    Baseline walks with camboot, and 1 crutch    Time 6    Period Weeks    Status New    Target Date 08/28/20      PT LONG TERM GOAL #4   Title pt to be IND with all HEP and is able to maintain and progress current LOF IND    Baseline no previous HEP    Time 6    Period Weeks    Status New    Target Date 08/28/20                 Plan - 07/16/20 1116    Clinical Impression Statement Pt arrives with 1 crutch, wearing cam boot and has significant antlagic pattern into clinic with step-to pattern. She reports MD has cleared her to remove boot full time. She did bring a shoe today and was able to don the shoe but ut was very tight. Began REc bike for ROM. Worked on gait in shoes with cues to attempt step-through pattern. She was able to improve step length on left but is limited by ROM. Continued with Seated and standing ankle ROM exercises. Issued red band for 4 way ankle strengthening. Education provided on the need for consistent HEP and working on her ROM to allow for normal gait. Some increased pain.    PT Next Visit Plan (no e-stim, VASo per INS) ankle mobs, gross ankle strengthening, gait training. bring her other shoe in to practice with shoe on vs boot.    PT Home Exercise Plan [id number] - ankle abc (insupine), towe inversion/eversion, seated heel/ toe raise, standing calf stretch, forward weight shifting           Patient will benefit from skilled therapeutic intervention in order to improve the following deficits  and impairments:  Improper body mechanics,Increased muscle spasms,Decreased strength,Abnormal gait,Obesity,Decreased activity tolerance,Decreased endurance,Decreased range of motion,Postural dysfunction  Visit Diagnosis: Pain in right ankle and joints of  right foot  Localized edema  S/P ORIF (open reduction internal fixation) fracture  Muscle weakness (generalized)  Other abnormalities of gait and mobility     Problem List Patient Active Problem List   Diagnosis Date Noted  . Right tibial fracture 05/14/2020    Sherrie Mustache, PTA 07/16/2020, 11:28 AM  [facility] [address] [place], [place], [address] Phone: [phone]   Fax:  [phone]  Name: Evelyn Moody MRN: [id number] Date of Birth: 1976/02/15

## 2020-07-21 ENCOUNTER — Ambulatory Visit: Payer: Medicaid Other

## 2020-07-21 ENCOUNTER — Other Ambulatory Visit: Payer: Self-pay

## 2020-07-21 DIAGNOSIS — R600 Localized edema: Secondary | ICD-10-CM

## 2020-07-21 DIAGNOSIS — Z8781 Personal history of (healed) traumatic fracture: Secondary | ICD-10-CM

## 2020-07-21 DIAGNOSIS — Z9889 Other specified postprocedural states: Secondary | ICD-10-CM

## 2020-07-21 DIAGNOSIS — M25571 Pain in right ankle and joints of right foot: Secondary | ICD-10-CM

## 2020-07-21 DIAGNOSIS — M6281 Muscle weakness (generalized): Secondary | ICD-10-CM

## 2020-07-21 DIAGNOSIS — R2689 Other abnormalities of gait and mobility: Secondary | ICD-10-CM

## 2020-07-21 NOTE — Therapy (Signed)
[facility] [address] [place], [place], [address] Phone: [phone]   Fax:  [phone]  Physical Therapy Treatment  Patient Details  Name: Evelyn Moody MRN: [id number] Date of Birth: December 06, 1975 Referring Provider (PT): Sheral Apley, MD   Encounter Date: 07/21/2020   PT End of Session - 07/21/20 1112    Visit Number 6    Number of Visits 13    Date for PT Re-Evaluation 08/28/20    Authorization Type Healthy blue MD- Berkley Harvey pending    PT Start Time 1100    PT Stop Time 1145    PT Time Calculation (min) 45 min    Activity Tolerance Patient tolerated treatment well    Behavior During Therapy [facility] for tasks assessed/performed           Past Medical History:  Diagnosis Date  . Anemia   . History of blood transfusion   . Hypertension   . PONV (postoperative nausea and vomiting)     Past Surgical History:  Procedure Laterality Date  . ABDOMINAL HYSTERECTOMY    . CESAREAN SECTION    . ORIF TIBIA FRACTURE Right 05/14/2020   Procedure: OPEN REDUCTION INTERNAL FIXATION (ORIF) TIBIA FRACTURE;  Surgeon: Sheral Apley, MD;  Location: [facility];  Service: Orthopedics;  Laterality: Right;    There were no vitals filed for this visit.   Subjective Assessment - 07/21/20 1111    Subjective "I only wear the boot when I come here. Yesterday, I wore the shoe when I was walking up/down the stairs. I had to go sideways and hold the rail with both hands. I've been doing my exercises."    Diagnostic tests CT 05/12/2020 IMPRESSION:  1. Acute bimalleolar fracture of the right ankle, as described  above.  2. Mild widening of the medial clear space.  3. Mid fibular diaphyseal fracture with mildly displaced and  angulated 6.0 cm butterfly fragment.  4. Tibialis posterior and flexor digitorum longus tendons closely  approximate the fracture site at the posteromedial aspect of the  distal tibia. Correlate for signs of tendinous entrapment.  5.  Diffuse soft tissue swelling about the lower leg, ankle, and  dorsal aspect of the foot. No organized fluid collection or  hematoma.    Currently in Pain? No/denies                             [facility] Adult PT Treatment/Exercise - 07/21/20 0001      Ankle Exercises: Seated   Towel Inversion/Eversion Weights   x10 2# DB   Heel Raises 15 reps   10# DB   Toe Raise 15 reps    Heel Slides 5 reps   max verbal/tactile cues for HKA alignment, self OP     Ankle Exercises: Stretches   Gastroc Stretch 2 reps;30 seconds   with towel   Other Stretch standing runners stretch at back of chair   ankle neutral   Other Stretch lateral hip/LE S with strap      Ankle Exercises: Aerobic   Recumbent Bike 5 minute full revolutons for ROM                  PT Education - 07/21/20 1249    Education Details outer hip/leg stretch in addition to HS stretch with strap, edu on outer leg tightness increased foot/hip ER    Person(s) Educated Patient    Methods Explanation;Demonstration;Tactile cues;Verbal cues  Comprehension Verbalized understanding;Returned demonstration;Verbal cues required;Tactile cues required            PT Short Term Goals - 07/08/20 1604      PT SHORT TERM GOAL #1   Title pt to be IND with inital HEP    Status On-going      PT SHORT TERM GOAL #2   Title reduce R ankle edema by >/= 1cm to promote ankle ROM    Status On-going      PT SHORT TERM GOAL #3   Title increase ankle DF to >/= 0 degrees (neutral) for improvement in functional mobility    Status On-going             PT Long Term Goals - 07/03/20 0931      PT LONG TERM GOAL #1   Title increaes ankle DF >/=6 degrees and PF to >/= 50 degrees to promtoe ROM required for gait efficency    Baseline see flow sheet    Time 6    Period Weeks    Status New    Target Date 08/28/20      PT LONG TERM GOAL #2   Title increaes gross R ankle strenght to >/= 4+/5 to promote ankle stability with  standing/ walking    Baseline see flow sheet    Time 6    Period Weeks    Status New    Target Date 08/28/20      PT LONG TERM GOAL #3   Title pt to walk for >/= 60 min demonstrating efficent gait pattern with no AD for endurance required for work related activities.    Baseline walks with camboot, and 1 crutch    Time 6    Period Weeks    Status New    Target Date 08/28/20      PT LONG TERM GOAL #4   Title pt to be IND with all HEP and is able to maintain and progress current LOF IND    Baseline no previous HEP    Time 6    Period Weeks    Status New    Target Date 08/28/20                 Plan - 07/21/20 1113    Clinical Impression Statement Pt arrives with 1 crutch that she was not using to ambulate in, wearing cam boot, continued antalgic gait pattern with significant R LE ER, weight bearing through medial foot and into pronation. Pt donned regular shoe for entirety of session and did not put boot back on at the end. Focused extensively on HKA alignment and foot/ankle alignment with stretching, ROM, and strengthening. Post session, pt ambulated with increased weight bearing to R LE and much improved alignment with decreased turn out.    PT Next Visit Plan (no e-stim, VASo per INS) ankle mobs, gross ankle strengthening, gait training with shoes vs boot    PT Home Exercise Plan [id number] - ankle abc (insupine), towe inversion/eversion, seated heel/ toe raise, standing calf stretch, forward weight shifting           Patient will benefit from skilled therapeutic intervention in order to improve the following deficits and impairments:  Improper body mechanics,Increased muscle spasms,Decreased strength,Abnormal gait,Obesity,Decreased activity tolerance,Decreased endurance,Decreased range of motion,Postural dysfunction  Visit Diagnosis: Pain in right ankle and joints of right foot  Localized edema  S/P ORIF (open reduction internal fixation) fracture  Muscle weakness  (generalized)  Other abnormalities of gait and  mobility     Problem List Patient Active Problem List   Diagnosis Date Noted  . Right tibial fracture 05/14/2020    Marcelline Mates, PT, DPT 07/21/2020, 12:58 PM  [facility] [address] [place], [place], [address] Phone: [phone]   Fax:  [phone]  Name: Evelyn Moody MRN: [id number] Date of Birth: 02-Jul-1975

## 2020-07-23 ENCOUNTER — Ambulatory Visit: Payer: Medicaid Other | Admitting: Physical Therapy

## 2020-07-23 ENCOUNTER — Other Ambulatory Visit: Payer: Self-pay

## 2020-07-23 ENCOUNTER — Encounter: Payer: Self-pay | Admitting: Physical Therapy

## 2020-07-23 DIAGNOSIS — M6281 Muscle weakness (generalized): Secondary | ICD-10-CM

## 2020-07-23 DIAGNOSIS — Z8781 Personal history of (healed) traumatic fracture: Secondary | ICD-10-CM

## 2020-07-23 DIAGNOSIS — Z9889 Other specified postprocedural states: Secondary | ICD-10-CM | POA: Diagnosis not present

## 2020-07-23 DIAGNOSIS — R600 Localized edema: Secondary | ICD-10-CM

## 2020-07-23 DIAGNOSIS — M25571 Pain in right ankle and joints of right foot: Secondary | ICD-10-CM

## 2020-07-23 DIAGNOSIS — R2689 Other abnormalities of gait and mobility: Secondary | ICD-10-CM

## 2020-07-23 NOTE — Therapy (Signed)
[place], [place], [address] Phone: [phone]   Fax:  [phone]  Physical Therapy Treatment  Patient Details  Name: Evelyn Moody MRN: [id number] Date of Birth: 26-Jan-1976 Referring Provider (PT): Renette Butters, MD   Encounter Date: 07/23/2020   PT End of Session - 07/23/20 0919    Visit Number 7    Number of Visits 13    Date for PT Re-Evaluation 08/28/20    Authorization Type [employer] MD- Josem Kaufmann pending    Authorization Time Period 07/08/20-47/22    Authorization - Visit Number 6    Authorization - Number of Visits 12    PT Start Time 0845    PT Stop Time 0925    PT Time Calculation (min) 40 min           Past Medical History:  Diagnosis Date  . Anemia   . History of blood transfusion   . Hypertension   . PONV (postoperative nausea and vomiting)     Past Surgical History:  Procedure Laterality Date  . ABDOMINAL HYSTERECTOMY    . CESAREAN SECTION    . ORIF TIBIA FRACTURE Right 05/14/2020   Procedure: OPEN REDUCTION INTERNAL FIXATION (ORIF) TIBIA FRACTURE;  Surgeon: Renette Butters, MD;  Location: [facility];  Service: Orthopedics;  Laterality: Right;    There were no vitals filed for this visit.   Subjective Assessment - 07/23/20 0851    Subjective "I am not really in pain but aching because of the weather."    Currently in Pain? No/denies              [facility] PT Assessment - 07/23/20 0001      AROM   Right Ankle Dorsiflexion 3    Right Ankle Plantar Flexion 38      PROM   Right Ankle Dorsiflexion 5                         [facility] Adult PT Treatment/Exercise - 07/23/20 0001      Ambulation/Gait   Ambulation Distance (Feet) 100 Feet    Assistive device L Axillary Crutch    Gait Comments cues for step length, heel stike and, neutral alignment      Manual Therapy   Manual therapy comments PROM all planes      Ankle Exercises: Aerobic   Recumbent Bike 5  minute full revolutons for ROM      Ankle Exercises: Seated   Towel Crunch 5 reps    BAPS Level 2;Sitting;10 reps   DF/PF, inversion / Eversion - increaesd difficulty with inversion/ eversion requiring min A to promote ROM, eventually halted Inversion/ eversion.   Other Seated Ankle Exercises seated towel ankle inversion/eversion with PT verbal and visual cues for technique x 20; seated heel/toe raise x 20      Ankle Exercises: Stretches   Soleus Stretch 3 reps;30 seconds   seated with towel   Gastroc Stretch 2 reps;30 seconds   with towel   Other Stretch standing runners stretch at back of chair   ankle neutral     Ankle Exercises: Supine   T-Band red theraband 4-way ankle strength 1 x 15                    PT Short Term Goals - 07/23/20 0916      PT SHORT TERM GOAL #1   Title pt to be IND with inital HEP  Time 3    Period Weeks    Status Achieved    Target Date 07/24/20      PT SHORT TERM GOAL #2   Title reduce R ankle edema by >/= 1cm to promote ankle ROM    Time 3    Period Weeks    Status Unable to assess    Target Date 07/24/20      PT SHORT TERM GOAL #3   Title increase ankle DF to >/= 0 degrees (neutral) for improvement in functional mobility    Baseline +2 DF on 07/14/20    Time 3    Period Weeks    Status Achieved    Target Date 07/24/20             PT Long Term Goals - 07/03/20 0931      PT LONG TERM GOAL #1   Title increaes ankle DF >/=6 degrees and PF to >/= 50 degrees to promtoe ROM required for gait efficency    Baseline see flow sheet    Time 6    Period Weeks    Status New    Target Date 08/28/20      PT LONG TERM GOAL #2   Title increaes gross R ankle strenght to >/= 4+/5 to promote ankle stability with standing/ walking    Baseline see flow sheet    Time 6    Period Weeks    Status New    Target Date 08/28/20      PT LONG TERM GOAL #3   Title pt to walk for >/= 60 min demonstrating efficent gait pattern with no AD for  endurance required for work related activities.    Baseline walks with camboot, and 1 crutch    Time 6    Period Weeks    Status New    Target Date 08/28/20      PT LONG TERM GOAL #4   Title pt to be IND with all HEP and is able to maintain and progress current LOF IND    Baseline no previous HEP    Time 6    Period Weeks    Status New    Target Date 08/28/20                 Plan - 07/23/20 0851    Clinical Impression Statement Pt arrives carrying one crutch "just in case due to the rain." Her gait is antalgic. USed crutch to decrease antalgic pattern and encouraged her to use it until she does not limp. Good carryover after gait training with crutch. Also lowered her crutch to appropriate height and she reported that it felt better. Currently, she plans to retrun to work on March 30th. Discussed making gait her priority so she does not return to work with a limp. She reports she does not have a lot of pain with weightbearing on RLE , just a hesitancy that causes her to limp. Continued with ankle ROM and closed/open chain calf stretching. She has been mindful of her LE alignment. STG# 1, #3 met.    PT Next Visit Plan (no e-stim, VASo per INS) ankle mobs, gross ankle strengthening, gait training with shoes  and one crutch with progressive weaning as able (RTW date March 30th. )    PT Home Exercise Plan [id number] - ankle abc (insupine), towe inversion/eversion, seated heel/ toe raise, standing calf stretch, forward weight shifting           Patient will benefit from  skilled therapeutic intervention in order to improve the following deficits and impairments:  Improper body mechanics,Increased muscle spasms,Decreased strength,Abnormal gait,Obesity,Decreased activity tolerance,Decreased endurance,Decreased range of motion,Postural dysfunction  Visit Diagnosis: Pain in right ankle and joints of right foot  S/P ORIF (open reduction internal fixation) fracture  Muscle weakness  (generalized)  Other abnormalities of gait and mobility  Localized edema     Problem List Patient Active Problem List   Diagnosis Date Noted  . Right tibial fracture 05/14/2020    Dorene Ar, PTA 07/23/2020, 10:20 AM  [place], [place], [address] Phone: [phone]   Fax:  [phone]  Name: Evelyn Moody MRN: [id number] Date of Birth: 1975-09-23

## 2020-07-24 ENCOUNTER — Ambulatory Visit: Payer: Medicaid Other | Admitting: Physical Therapy

## 2020-07-28 ENCOUNTER — Other Ambulatory Visit: Payer: Self-pay

## 2020-07-28 ENCOUNTER — Encounter: Payer: Self-pay | Admitting: Physical Therapy

## 2020-07-28 ENCOUNTER — Ambulatory Visit: Payer: Medicaid Other | Admitting: Physical Therapy

## 2020-07-28 DIAGNOSIS — Z9889 Other specified postprocedural states: Secondary | ICD-10-CM

## 2020-07-28 DIAGNOSIS — Z8781 Personal history of (healed) traumatic fracture: Secondary | ICD-10-CM

## 2020-07-28 DIAGNOSIS — M25571 Pain in right ankle and joints of right foot: Secondary | ICD-10-CM

## 2020-07-28 DIAGNOSIS — R2689 Other abnormalities of gait and mobility: Secondary | ICD-10-CM

## 2020-07-28 DIAGNOSIS — R600 Localized edema: Secondary | ICD-10-CM

## 2020-07-28 DIAGNOSIS — M6281 Muscle weakness (generalized): Secondary | ICD-10-CM

## 2020-07-28 NOTE — Therapy (Signed)
[place], [place], [address] Phone: [phone]   Fax:  [phone]  Physical Therapy Treatment  Patient Details  Name: Evelyn Moody MRN: [id number] Date of Birth: Oct 28, 1975 Referring Provider (PT): Renette Butters, MD   Encounter Date: 07/28/2020   PT End of Session - 07/28/20 1103    Visit Number 8    Number of Visits 13    Date for PT Re-Evaluation 08/28/20    Authorization Type [employer] MD- Josem Kaufmann pending    Authorization Time Period 07/08/20-47/22    Authorization - Visit Number 7    Authorization - Number of Visits 12    PT Start Time 1057    Activity Tolerance Patient tolerated treatment well    Behavior During Therapy [facility] for tasks assessed/performed           Past Medical History:  Diagnosis Date  . Anemia   . History of blood transfusion   . Hypertension   . PONV (postoperative nausea and vomiting)     Past Surgical History:  Procedure Laterality Date  . ABDOMINAL HYSTERECTOMY    . CESAREAN SECTION    . ORIF TIBIA FRACTURE Right 05/14/2020   Procedure: OPEN REDUCTION INTERNAL FIXATION (ORIF) TIBIA FRACTURE;  Surgeon: Renette Butters, MD;  Location: [facility];  Service: Orthopedics;  Laterality: Right;    There were no vitals filed for this visit.   Subjective Assessment - 07/28/20 1102    Subjective No pain. Doing alot better. No longer using crutch. I was able to set up and clean up for a birthday party saturday and was on my feet alot with no pain.    Diagnostic tests CT 05/12/2020 IMPRESSION:  1. Acute bimalleolar fracture of the right ankle, as described  above.  2. Mild widening of the medial clear space.  3. Mid fibular diaphyseal fracture with mildly displaced and  angulated 6.0 cm butterfly fragment.  4. Tibialis posterior and flexor digitorum longus tendons closely  approximate the fracture site at the posteromedial aspect of the  distal tibia. Correlate for signs of tendinous  entrapment.  5. Diffuse soft tissue swelling about the lower leg, ankle, and  dorsal aspect of the foot. No organized fluid collection or  hematoma.    Currently in Pain? No/denies              [facility] PT Assessment - 07/28/20 0001      Figure 8 Edema   Figure 8 - Right  54.0      AROM   Right Ankle Dorsiflexion 3    Right Ankle Plantar Flexion 43                         [facility] Adult PT Treatment/Exercise - 07/28/20 0001      Ambulation/Gait   Ambulation Distance (Feet) 100 Feet    Assistive device None    Gait Comments min antalgic gait      Manual Therapy   Manual therapy comments PROM all planes    Joint Mobilization grade III PA  TCJ to promote PF, also DF mobs      Ankle Exercises: Standing   Rocker Board 1 minute   forward/back and side to side   Heel Raises 10 reps    Toe Raise 10 reps      Ankle Exercises: Aerobic   Recumbent Bike 5 minutes L2      Ankle Exercises: Stretches   Soleus  Stretch 3 reps;30 seconds   seated with towel   Gastroc Stretch 2 reps;30 seconds   with towel     Ankle Exercises: Seated   Other Seated Ankle Exercises seated towel ankle inversion/eversion with PT verbal and visual cues for technique x 20; seated heel/toe raise x 20      Ankle Exercises: Supine   T-Band red theraband 4-way ankle strength 1 x 15                    PT Short Term Goals - 07/28/20 1247      PT SHORT TERM GOAL #1   Title pt to be IND with inital HEP    Time 3    Period Weeks    Status Achieved    Target Date 07/24/20      PT SHORT TERM GOAL #2   Title reduce R ankle edema by >/= 1cm to promote ankle ROM    Time 3    Period Weeks    Status Achieved      PT SHORT TERM GOAL #3   Title increase ankle DF to >/= 0 degrees (neutral) for improvement in functional mobility    Baseline 3-43    Time 3    Period Weeks    Status Achieved             PT Long Term Goals - 07/03/20 0931      PT LONG TERM GOAL #1   Title increaes  ankle DF >/=6 degrees and PF to >/= 50 degrees to promtoe ROM required for gait efficency    Baseline see flow sheet    Time 6    Period Weeks    Status New    Target Date 08/28/20      PT LONG TERM GOAL #2   Title increaes gross R ankle strenght to >/= 4+/5 to promote ankle stability with standing/ walking    Baseline see flow sheet    Time 6    Period Weeks    Status New    Target Date 08/28/20      PT LONG TERM GOAL #3   Title pt to walk for >/= 60 min demonstrating efficent gait pattern with no AD for endurance required for work related activities.    Baseline walks with camboot, and 1 crutch    Time 6    Period Weeks    Status New    Target Date 08/28/20      PT LONG TERM GOAL #4   Title pt to be IND with all HEP and is able to maintain and progress current LOF IND    Baseline no previous HEP    Time 6    Period Weeks    Status New    Target Date 08/28/20                 Plan - 07/28/20 1114    Clinical Impression Statement Pt arrives carrying the crutch and reports improvement in gait. She demonstrates improved gait mechanics, with min antalgic pattern. Began standing heel toe raises with good tolerance. Able to hold tandem stance 7 seconds with RLE back. Updated HEP with standing heel raises and asked her to discontinue if painful. She plans to return to work March 31st. She could continue PT with afternoon appointments if needed to return to PLOF. Her ankle ROM is limited 3-43 degrees. Encouraged her to work on stretching and strengthening while being careful not to over do  it. her edema is much improved STG#2 met.    PT Next Visit Plan (no e-stim, VASo per INS) ankle mobs, gross ankle strengthening, gait training with shoes  and one crutch with progressive weaning as able (RTW date March 31th. )    PT Home Exercise Plan [id number] - ankle abc (insupine), towe inversion/eversion, seated heel/ toe raise, standing calf stretch, forward weight shifting, added standing  heel raises           Patient will benefit from skilled therapeutic intervention in order to improve the following deficits and impairments:  Improper body mechanics,Increased muscle spasms,Decreased strength,Abnormal gait,Obesity,Decreased activity tolerance,Decreased endurance,Decreased range of motion,Postural dysfunction  Visit Diagnosis: Pain in right ankle and joints of right foot  S/P ORIF (open reduction internal fixation) fracture  Muscle weakness (generalized)  Other abnormalities of gait and mobility  Localized edema     Problem List Patient Active Problem List   Diagnosis Date Noted  . Right tibial fracture 05/14/2020    Dorene Ar, PTA 07/28/2020, 12:48 PM  [facility] [address] [place], [place], [address] Phone: [phone]   Fax:  [phone]  Name: Evelyn Moody MRN: [id number] Date of Birth: 07/07/1975

## 2020-07-30 ENCOUNTER — Ambulatory Visit: Payer: Medicaid Other | Admitting: Physical Therapy

## 2020-07-30 ENCOUNTER — Other Ambulatory Visit: Payer: Self-pay

## 2020-07-30 ENCOUNTER — Encounter: Payer: Self-pay | Admitting: Physical Therapy

## 2020-07-30 DIAGNOSIS — R600 Localized edema: Secondary | ICD-10-CM

## 2020-07-30 DIAGNOSIS — M6281 Muscle weakness (generalized): Secondary | ICD-10-CM

## 2020-07-30 DIAGNOSIS — Z9889 Other specified postprocedural states: Secondary | ICD-10-CM | POA: Diagnosis not present

## 2020-07-30 DIAGNOSIS — Z8781 Personal history of (healed) traumatic fracture: Secondary | ICD-10-CM

## 2020-07-30 DIAGNOSIS — R2689 Other abnormalities of gait and mobility: Secondary | ICD-10-CM

## 2020-07-30 DIAGNOSIS — M25571 Pain in right ankle and joints of right foot: Secondary | ICD-10-CM

## 2020-07-30 NOTE — Therapy (Signed)
[place], [place], [address] Phone: [phone]   Fax:  [phone]  Physical Therapy Treatment  Patient Details  Name: Evelyn Moody MRN: [id number] Date of Birth: 07/29/1975 Referring Provider (PT): Renette Butters, MD   Encounter Date: 07/30/2020   PT End of Session - 07/30/20 0846    Visit Number 9    Number of Visits 13    Date for PT Re-Evaluation 08/28/20    Authorization Type [employer] MD- Josem Kaufmann pending    Authorization Time Period 07/08/20-08/13/20    Authorization - Visit Number 8    Authorization - Number of Visits 12    PT Start Time 0175    PT Stop Time 0928    PT Time Calculation (min) 41 min    Activity Tolerance Patient tolerated treatment well    Behavior During Therapy [facility] for tasks assessed/performed           Past Medical History:  Diagnosis Date  . Anemia   . History of blood transfusion   . Hypertension   . PONV (postoperative nausea and vomiting)     Past Surgical History:  Procedure Laterality Date  . ABDOMINAL HYSTERECTOMY    . CESAREAN SECTION    . ORIF TIBIA FRACTURE Right 05/14/2020   Procedure: OPEN REDUCTION INTERNAL FIXATION (ORIF) TIBIA FRACTURE;  Surgeon: Renette Butters, MD;  Location: [facility];  Service: Orthopedics;  Laterality: Right;    There were no vitals filed for this visit.   Subjective Assessment - 07/30/20 0849    Subjective " no pain today, carrying the crutch because it's wet and I don't want to slip."    Diagnostic tests CT 05/12/2020 IMPRESSION:  1. Acute bimalleolar fracture of the right ankle, as described  above.  2. Mild widening of the medial clear space.  3. Mid fibular diaphyseal fracture with mildly displaced and  angulated 6.0 cm butterfly fragment.  4. Tibialis posterior and flexor digitorum longus tendons closely  approximate the fracture site at the posteromedial aspect of the  distal tibia. Correlate for signs of tendinous entrapment.   5. Diffuse soft tissue swelling about the lower leg, ankle, and  dorsal aspect of the foot. No organized fluid collection or  hematoma.    Currently in Pain? No/denies    Aggravating Factors  turning foot the wrong way but it goes away quickly.    Pain Relieving Factors N/A              [facility] PT Assessment - 07/30/20 0001      Assessment   Medical Diagnosis ORIF on disal the tibia    Referring Provider (PT) Renette Butters, MD      AROM   Right Ankle Dorsiflexion 5    Right Ankle Plantar Flexion 49      Strength   Right Ankle Dorsiflexion 4+/5    Right Ankle Plantar Flexion 4+/5    Right Ankle Inversion 4/5   soreness along the medial aspect of the ankle   Right Ankle Eversion 4+/5                         [facility] Adult PT Treatment/Exercise - 07/30/20 0001      Ankle Exercises: Seated   Other Seated Ankle Exercises 4 way ankle strength 1 x 20 ex with green band      Ankle Exercises: Stretches   Soleus Stretch 30 seconds;2 reps   slant  board   Gastroc Stretch 2 reps;30 seconds   slant board     Ankle Exercises: Aerobic   Nustep L4 x 5 min LE only      Ankle Exercises: Standing   Heel Raises 15 reps    Toe Raise 15 reps    Other Standing Ankle Exercises marching near counter 2 x 15 with HHA PRN focus on controlled lowering                  PT Education - 07/30/20 0947    Education Details walking without the crutch for safety, and during potentially slick conditions practicing slowing gait down avoiding quick motions and take time to maximize safety.    Person(s) Educated Patient    Methods Explanation;Verbal cues;Handout    Comprehension Verbalized understanding;Verbal cues required            PT Short Term Goals - 07/28/20 1247      PT SHORT TERM GOAL #1   Title pt to be IND with inital HEP    Time 3    Period Weeks    Status Achieved    Target Date 07/24/20      PT SHORT TERM GOAL #2   Title reduce R ankle edema by >/= 1cm to  promote ankle ROM    Time 3    Period Weeks    Status Achieved      PT SHORT TERM GOAL #3   Title increase ankle DF to >/= 0 degrees (neutral) for improvement in functional mobility    Baseline 3-43    Time 3    Period Weeks    Status Achieved             PT Long Term Goals - 07/30/20 4481      PT LONG TERM GOAL #1   Title increaes ankle DF >/=6 degrees and PF to >/= 50 degrees to promtoe ROM required for gait efficency    Baseline 5 df and 49 PF    Period Weeks    Status Partially Met      PT LONG TERM GOAL #2   Title increaes gross R ankle strenght to >/= 4+/5 to promote ankle stability with standing/ walking    Baseline 4+/5 for all motions except inversion    Period Weeks    Status Partially Met      PT LONG TERM GOAL #3   Title pt to walk for >/= 60 min demonstrating efficent gait pattern with no AD for endurance required for work related activities.    Period Weeks    Status Achieved      PT LONG TERM GOAL #4   Title pt to be IND with all HEP and is able to maintain and progress current LOF IND                 Plan - 07/30/20 0943    Clinical Impression Statement Evelyn Moody is making excellent progress with physical therapy increasing her ankle ROM and strength. continued working on ankle strength progressing resistance and stretch. She did well with gait training and marching for balance training. opted ot see pt for 1 x week for 2 more weeks to progress HEP and work toward discharge.    PT Next Visit Plan (no e-stim, VASo per INS) ankle mobs, gross ankle strengthening, gait training with shoes  and one crutch with progressive weaning as able (RTW date March 31th. )    Consulted and Agree with  Plan of Care Patient           Patient will benefit from skilled therapeutic intervention in order to improve the following deficits and impairments:  Improper body mechanics,Increased muscle spasms,Decreased strength,Abnormal gait,Obesity,Decreased activity  tolerance,Decreased endurance,Decreased range of motion,Postural dysfunction  Visit Diagnosis: Other abnormalities of gait and mobility  Pain in right ankle and joints of right foot  S/P ORIF (open reduction internal fixation) fracture  Muscle weakness (generalized)  Localized edema     Problem List Patient Active Problem List   Diagnosis Date Noted  . Right tibial fracture 05/14/2020   Starr Lake PT, DPT, LAT, ATC  07/30/20  9:48 AM      [facility] [address] [place], [place], [address] Phone: [phone]   Fax:  [phone]  Name: Evelyn Moody MRN: [id number] Date of Birth: 1975/07/23

## 2020-07-31 ENCOUNTER — Ambulatory Visit: Payer: Medicaid Other | Admitting: Physical Therapy

## 2020-08-04 ENCOUNTER — Ambulatory Visit: Payer: Medicaid Other | Admitting: Physical Therapy

## 2020-08-04 ENCOUNTER — Other Ambulatory Visit: Payer: Self-pay

## 2020-08-04 DIAGNOSIS — M6281 Muscle weakness (generalized): Secondary | ICD-10-CM

## 2020-08-04 DIAGNOSIS — Z8781 Personal history of (healed) traumatic fracture: Secondary | ICD-10-CM

## 2020-08-04 DIAGNOSIS — Z9889 Other specified postprocedural states: Secondary | ICD-10-CM | POA: Diagnosis not present

## 2020-08-04 DIAGNOSIS — R2689 Other abnormalities of gait and mobility: Secondary | ICD-10-CM

## 2020-08-04 DIAGNOSIS — R600 Localized edema: Secondary | ICD-10-CM

## 2020-08-04 DIAGNOSIS — M25571 Pain in right ankle and joints of right foot: Secondary | ICD-10-CM

## 2020-08-04 NOTE — Therapy (Addendum)
[place], [place], [address] Phone: [phone]   Fax:  [phone]  Physical Therapy Treatment / Discharge  Patient Details  Name: Evelyn Moody MRN: [id number] Date of Birth: March 10, 1976 Referring Provider (PT): Renette Butters, MD   Encounter Date: 08/04/2020   PT End of Session - 08/04/20 0906    Visit Number 10    Number of Visits 13    Date for PT Re-Evaluation 08/28/20    Authorization Type [employer] MD- Josem Kaufmann pending    Authorization Time Period 07/08/20-08/13/20    Authorization - Visit Number 9    Authorization - Number of Visits 12    PT Start Time 3419    PT Stop Time 0925    PT Time Calculation (min) 39 min    Activity Tolerance Patient tolerated treatment well           Past Medical History:  Diagnosis Date  . Anemia   . History of blood transfusion   . Hypertension   . PONV (postoperative nausea and vomiting)     Past Surgical History:  Procedure Laterality Date  . ABDOMINAL HYSTERECTOMY    . CESAREAN SECTION    . ORIF TIBIA FRACTURE Right 05/14/2020   Procedure: OPEN REDUCTION INTERNAL FIXATION (ORIF) TIBIA FRACTURE;  Surgeon: Renette Butters, MD;  Location: [facility];  Service: Orthopedics;  Laterality: Right;    There were no vitals filed for this visit.   Subjective Assessment - 08/04/20 0851    Subjective " I am doing pretty good, just some stiffness in the ankle with the weather change."    Diagnostic tests CT 05/12/2020 IMPRESSION:  1. Acute bimalleolar fracture of the right ankle, as described  above.  2. Mild widening of the medial clear space.  3. Mid fibular diaphyseal fracture with mildly displaced and  angulated 6.0 cm butterfly fragment.  4. Tibialis posterior and flexor digitorum longus tendons closely  approximate the fracture site at the posteromedial aspect of the  distal tibia. Correlate for signs of tendinous entrapment.  5. Diffuse soft tissue swelling about the  lower leg, ankle, and  dorsal aspect of the foot. No organized fluid collection or  hematoma.    Currently in Pain? No/denies              [facility] PT Assessment - 08/04/20 0001      Assessment   Medical Diagnosis ORIF on disal the tibia    Referring Provider (PT) Renette Butters, MD                         [facility] Adult PT Treatment/Exercise - 08/04/20 0001      Knee/Hip Exercises: Standing   Hip Abduction 2 sets;15 reps;Knee straight    Hip Extension 2 sets;15 reps;Knee straight    Wall Squat 2 sets;10 reps      Ankle Exercises: Aerobic   Tread Mill .8 x 2 min, 1.0 x 4 min      Ankle Exercises: Standing   Heel Raises 20 reps;Both   x 2 sets   Toe Raise 20 reps    Other Standing Ankle Exercises wall squat 2 x 10    Other Standing Ankle Exercises marching near counter 2 x 20 with HHA PRN focus on controlled lowering alternating L/R      Ankle Exercises: Stretches   Slant Board Stretch 2 reps;30 seconds   gastroc/ soleus  Balance Exercises - 08/04/20 0001      Balance Exercises: Standing   Standing Eyes Opened Narrow base of support (BOS);2 reps;30 secs    Standing Eyes Closed 3 reps;30 secs;Narrow base of support (BOS)   mild sway noted              PT Short Term Goals - 07/28/20 1247      PT SHORT TERM GOAL #1   Title pt to be IND with inital HEP    Time 3    Period Weeks    Status Achieved    Target Date 07/24/20      PT SHORT TERM GOAL #2   Title reduce R ankle edema by >/= 1cm to promote ankle ROM    Time 3    Period Weeks    Status Achieved      PT SHORT TERM GOAL #3   Title increase ankle DF to >/= 0 degrees (neutral) for improvement in functional mobility    Baseline 3-43    Time 3    Period Weeks    Status Achieved             PT Long Term Goals - 07/30/20 9518      PT LONG TERM GOAL #1   Title increaes ankle DF >/=6 degrees and PF to >/= 50 degrees to promtoe ROM required for gait efficency     Baseline 5 df and 49 PF    Period Weeks    Status Partially Met      PT LONG TERM GOAL #2   Title increaes gross R ankle strenght to >/= 4+/5 to promote ankle stability with standing/ walking    Baseline 4+/5 for all motions except inversion    Period Weeks    Status Partially Met      PT LONG TERM GOAL #3   Title pt to walk for >/= 60 min demonstrating efficent gait pattern with no AD for endurance required for work related activities.    Period Weeks    Status Achieved      PT LONG TERM GOAL #4   Title pt to be IND with all HEP and is able to maintain and progress current LOF IND                 Plan - 08/04/20 0914    Clinical Impression Statement Evelyn Moody continues to do very well, and came into her session today without the use of her crutches. continued working on gross ankle strengthening and working up the kinetic chain. continued to take intermittent standing breaks to promote standing endurance as it relates to her work tasks. plan to reassess next session and discharge.    PT Treatment/Interventions ADLs/Self Care Home Management;Cryotherapy;Moist Heat;Ultrasound;Gait training;Stair training;Functional mobility training;Therapeutic activities;Therapeutic exercise;Balance training;Neuromuscular re-education;Patient/family education;Manual techniques;Passive range of motion;Scar mobilization;Taping    PT Next Visit Plan (no e-stim, VASo per INS) review/ update HEP, d/C    PT Home Exercise Plan [id number] - ankle abc (insupine), towe inversion/eversion, seated heel/ toe raise, standing calf stretch, forward weight shifting, added standing heel raises    Consulted and Agree with Plan of Care Patient           Patient will benefit from skilled therapeutic intervention in order to improve the following deficits and impairments:  Improper body mechanics,Increased muscle spasms,Decreased strength,Abnormal gait,Obesity,Decreased activity tolerance,Decreased endurance,Decreased  range of motion,Postural dysfunction  Visit Diagnosis: Other abnormalities of gait and mobility  Pain in right ankle and  joints of right foot  S/P ORIF (open reduction internal fixation) fracture  Muscle weakness (generalized)  Localized edema     Problem List Patient Active Problem List   Diagnosis Date Noted  . Right tibial fracture 05/14/2020   Evelyn Moody PT, DPT, LAT, ATC  08/04/20  9:27 AM      [facility] [address] [place], [place], [address] Phone: [phone]   Fax:  [phone]  Name: Evelyn Moody MRN: [id number] Date of Birth: Sep 03, 1975      PHYSICAL THERAPY DISCHARGE SUMMARY  Visits from Start of Care: 10  Current functional level related to goals / functional outcomes: See goals   Remaining deficits: Current status unknown   Education / Equipment: HEP  Plan: Patient agrees to discharge.  Patient goals were partially met. Patient is being discharged due to not returning since the last visit.  ?????      Evelyn Moody PT, DPT, LAT, ATC  09/12/20  4:20 PM

## 2020-08-06 ENCOUNTER — Ambulatory Visit: Payer: Medicaid Other | Admitting: Physical Therapy

## 2020-08-11 ENCOUNTER — Ambulatory Visit: Payer: Medicaid Other | Admitting: Physical Therapy

## 2020-08-13 ENCOUNTER — Ambulatory Visit: Payer: Medicaid Other | Admitting: Physical Therapy

## 2021-08-01 IMAGING — CT CT ANKLE*R* W/O CM
3 of 6 series · 10 of 33 positions shown, 11 images · non-contrast
Comparison: None available

CLINICAL DATA: Right ankle fracture.  Date of injury 05/06/2020

EXAM:
CT OF THE RIGHT ANKLE WITHOUT CONTRAST
TECHNIQUE: Multidetector CT imaging of the right ankle was performed according
to the standard protocol. Multiplanar CT image reconstructions were
also generated.

[Series 5: axial bone · axial · 0.38mm/px · z∈[+444,+620]mm · 4 of 177 slices shown, 5 images]
[im 30/177  soft-tissue]
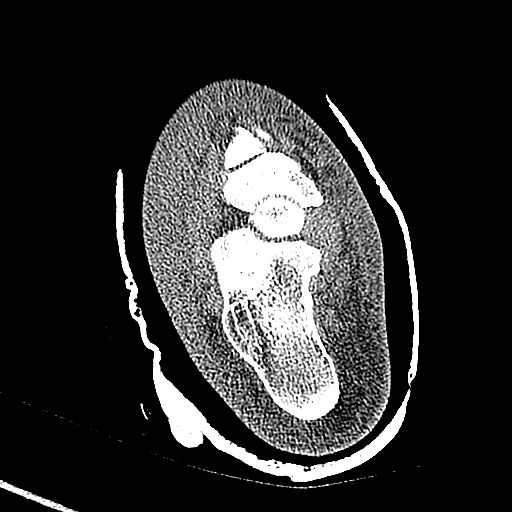
[im 30/177  bone]
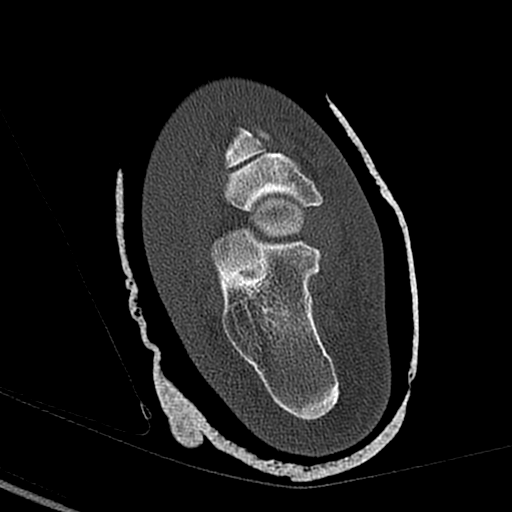
[im 59/177  bone]
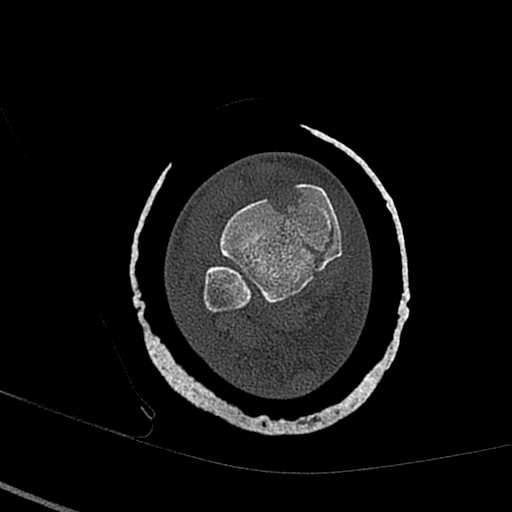
[im 118/177  bone]
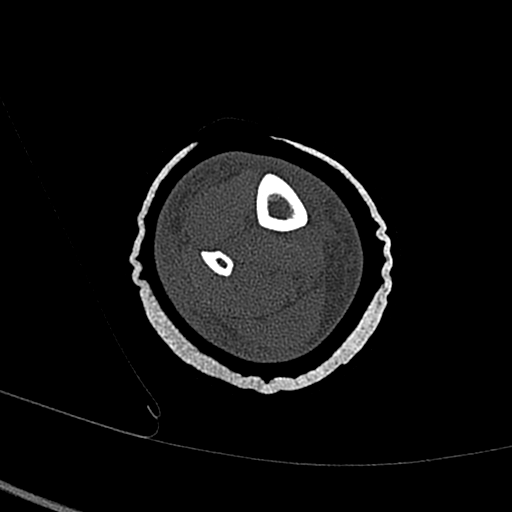
[im 147/177  bone]
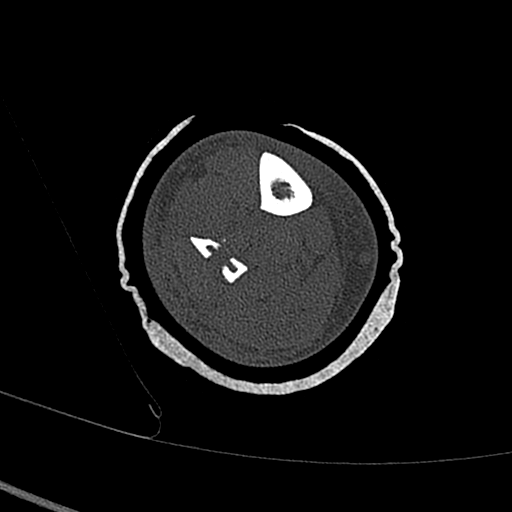

[Series 7: coronal bone · coronal · 0.26mm/px · 1 of 83 slices shown]
[im 42/83  bone]
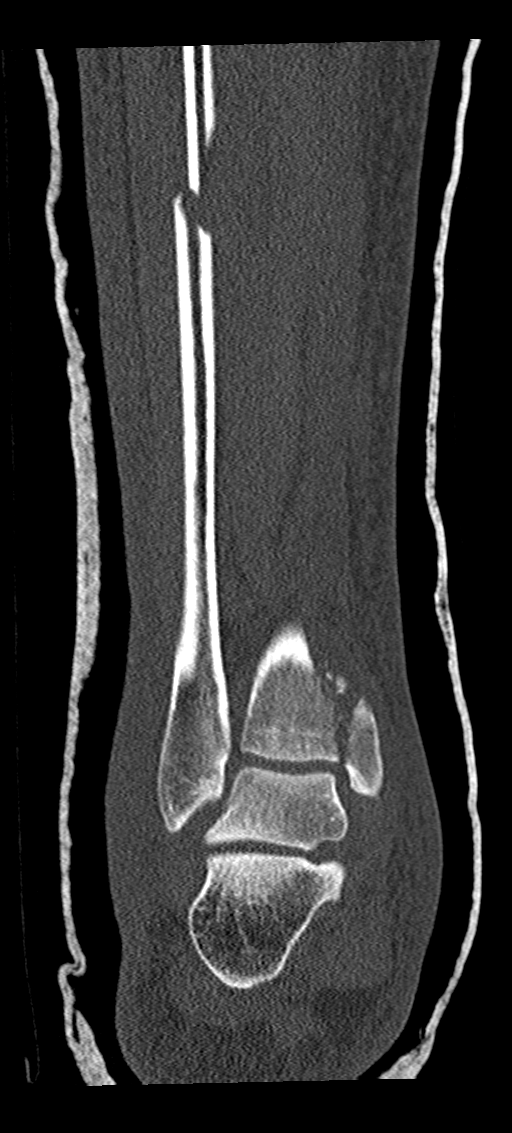

[Series 8: sagittal bone · sagittal · 0.24mm/px · 5 of 89 slices shown]
[im 15/89  bone]
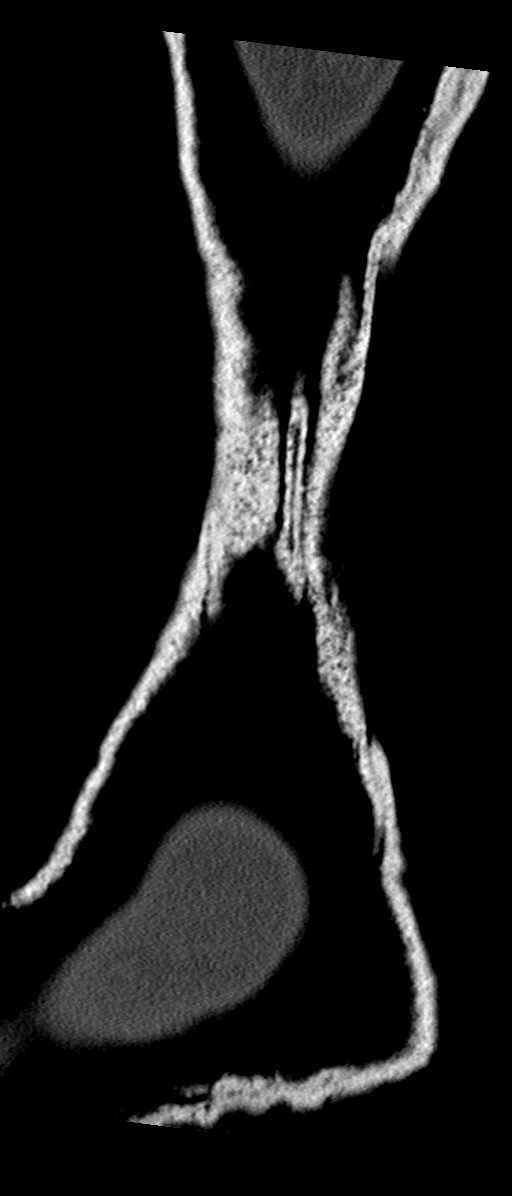
[im 30/89  bone]
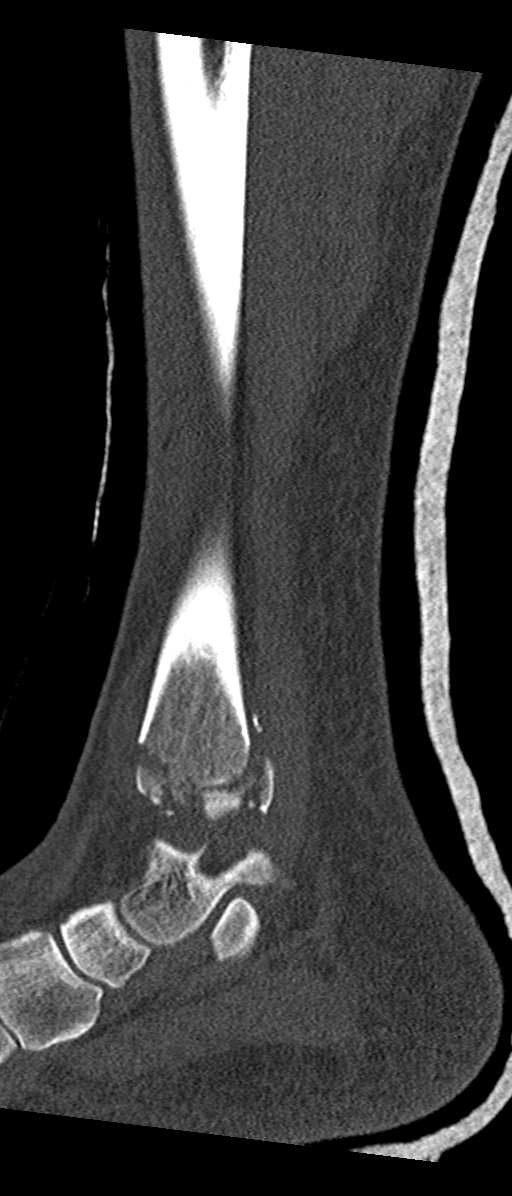
[im 45/89  bone]
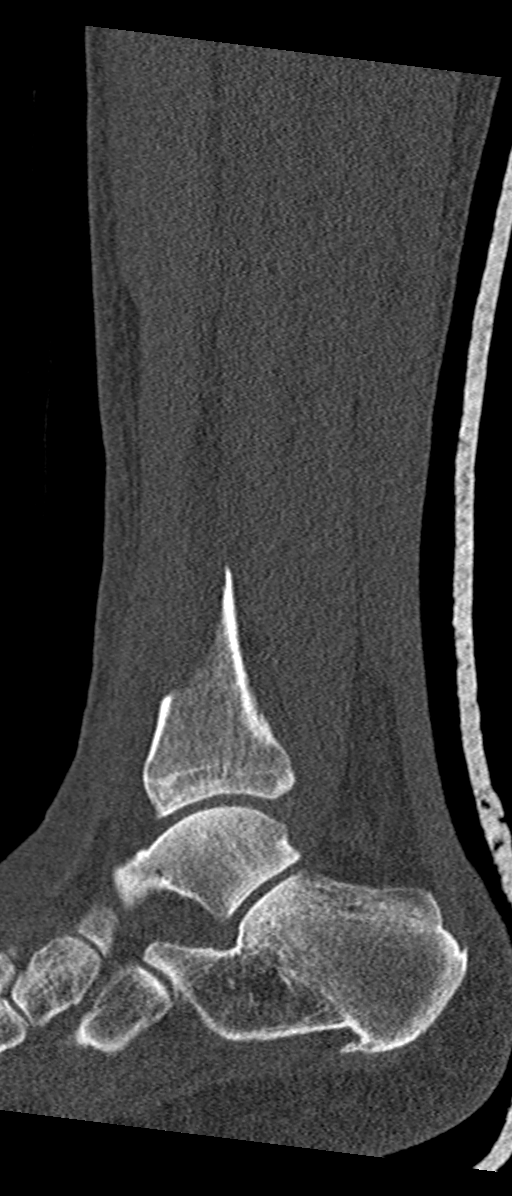
[im 59/89  bone]
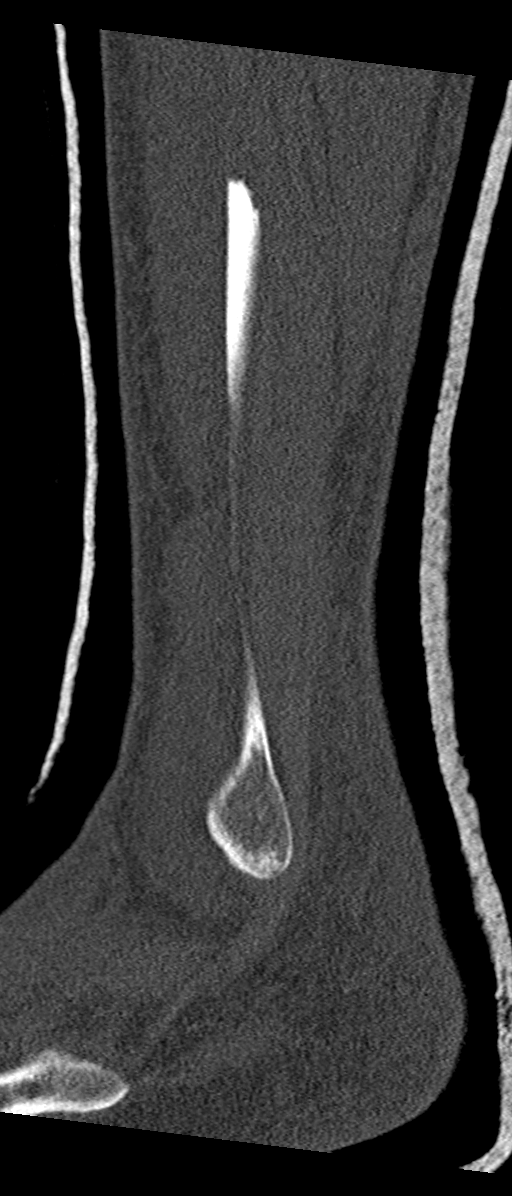
[im 74/89  bone]
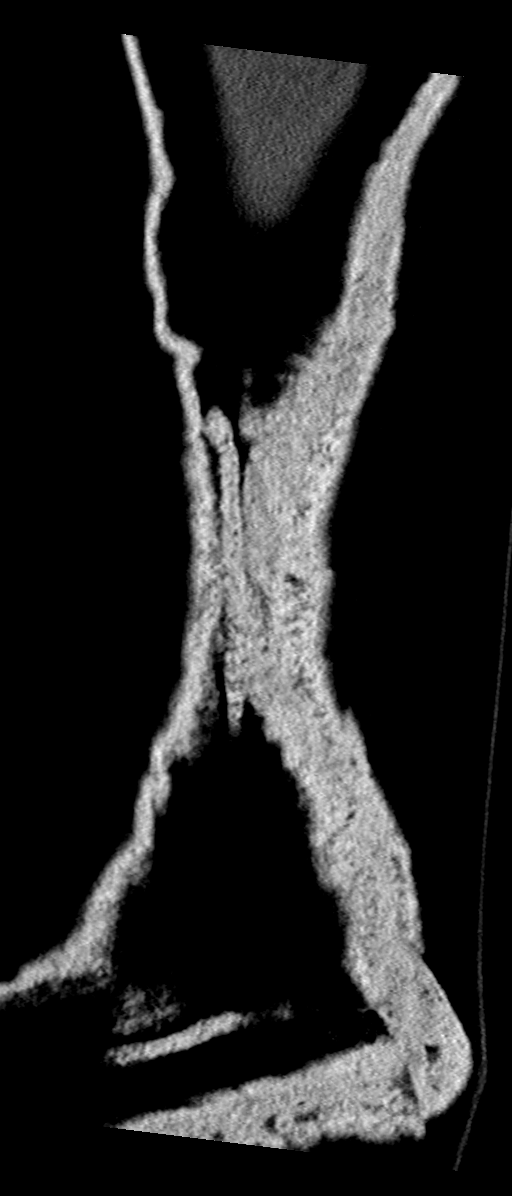

[10 of 33 positions shown; findings below may reference images not displayed]

FINDINGS: Bones/Joint/Cartilage

Hip acute obliquely oriented fracture of the distal tibia extending
from the lateral cortex of the distal tibial metadiaphysis to the
base of the medial malleolus. There is 6 mm of lateral displacement
and minimal posterior displacement at the fracture site. There is an
additional obliquely oriented fracture involving the base of the
medial malleolus with intra-articular extension to the ankle
mortise. Slight medial displacement of the fracture. Small
nondisplaced vertically oriented fracture through the posterior
malleolus without significant articular surface diastasis or
depression (series 8, image 49).

Spiral fracture of the mid fibular diaphysis with a 6.0 cm butterfly
fragment which is posteriorly displaced with slight posterior
angulation. There is approximately [DATE] shaft width of lateral
displacement across the fracture site. Lateral malleolus intact
without fracture.

Mild widening of the medial clear space. No tibiotalar dislocation.
Talus and calcaneus intact. Subtalar joints remain aligned.
Alignment within the midfoot remains anatomic.

Ligaments

Suboptimally assessed by CT.

Muscles and Tendons

Tibialis posterior and flexor digitorum longus tendons closely
approximate the fracture site at the posteromedial aspect of the
distal tibia. No intramuscular hematoma or other abnormality evident
by CT.

Soft tissues

There is diffuse soft tissue swelling about the lower leg, ankle,
and dorsal aspect of the foot. No organized fluid collection or
hematoma.
IMPRESSION: 1. Acute bimalleolar fracture of the right ankle, as described
above.
2. Mild widening of the medial clear space.
3. Mid fibular diaphyseal fracture with mildly displaced and
angulated 6.0 cm butterfly fragment.
4. Tibialis posterior and flexor digitorum longus tendons closely
approximate the fracture site at the posteromedial aspect of the
distal tibia. Correlate for signs of tendinous entrapment.
5. Diffuse soft tissue swelling about the lower leg, ankle, and
dorsal aspect of the foot. No organized fluid collection or
hematoma.

## 2021-08-03 IMAGING — RF DG TIBIA/FIBULA 2V*R*
1 series · 3 of 3 positions shown · non-contrast
Comparison: 05/12/2020

CLINICAL DATA: Right ankle tibial fracture, midshaft fibular
fracture, ORIF

EXAM:
RIGHT TIBIA AND FIBULA - 2 VIEW; DG C-ARM 1-60 MIN-NO REPORT

[Series 1: unknown protocol · 0.20mm/px · 3 of 3 slices shown]
[im 1/3]
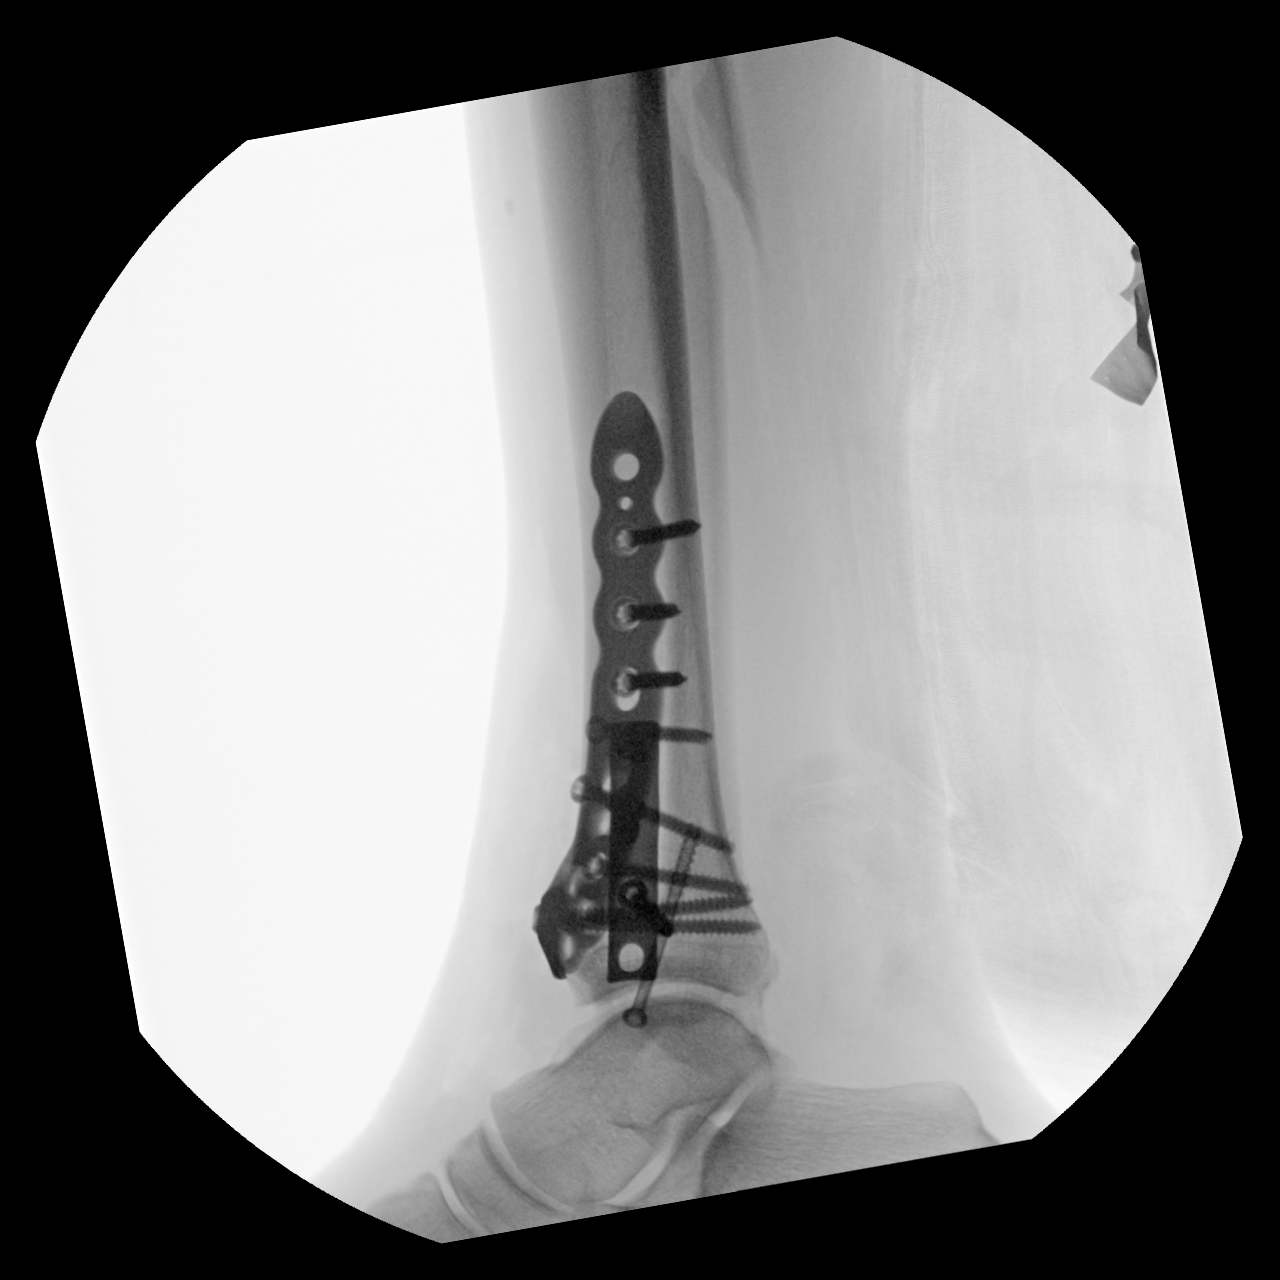
[im 2/3]
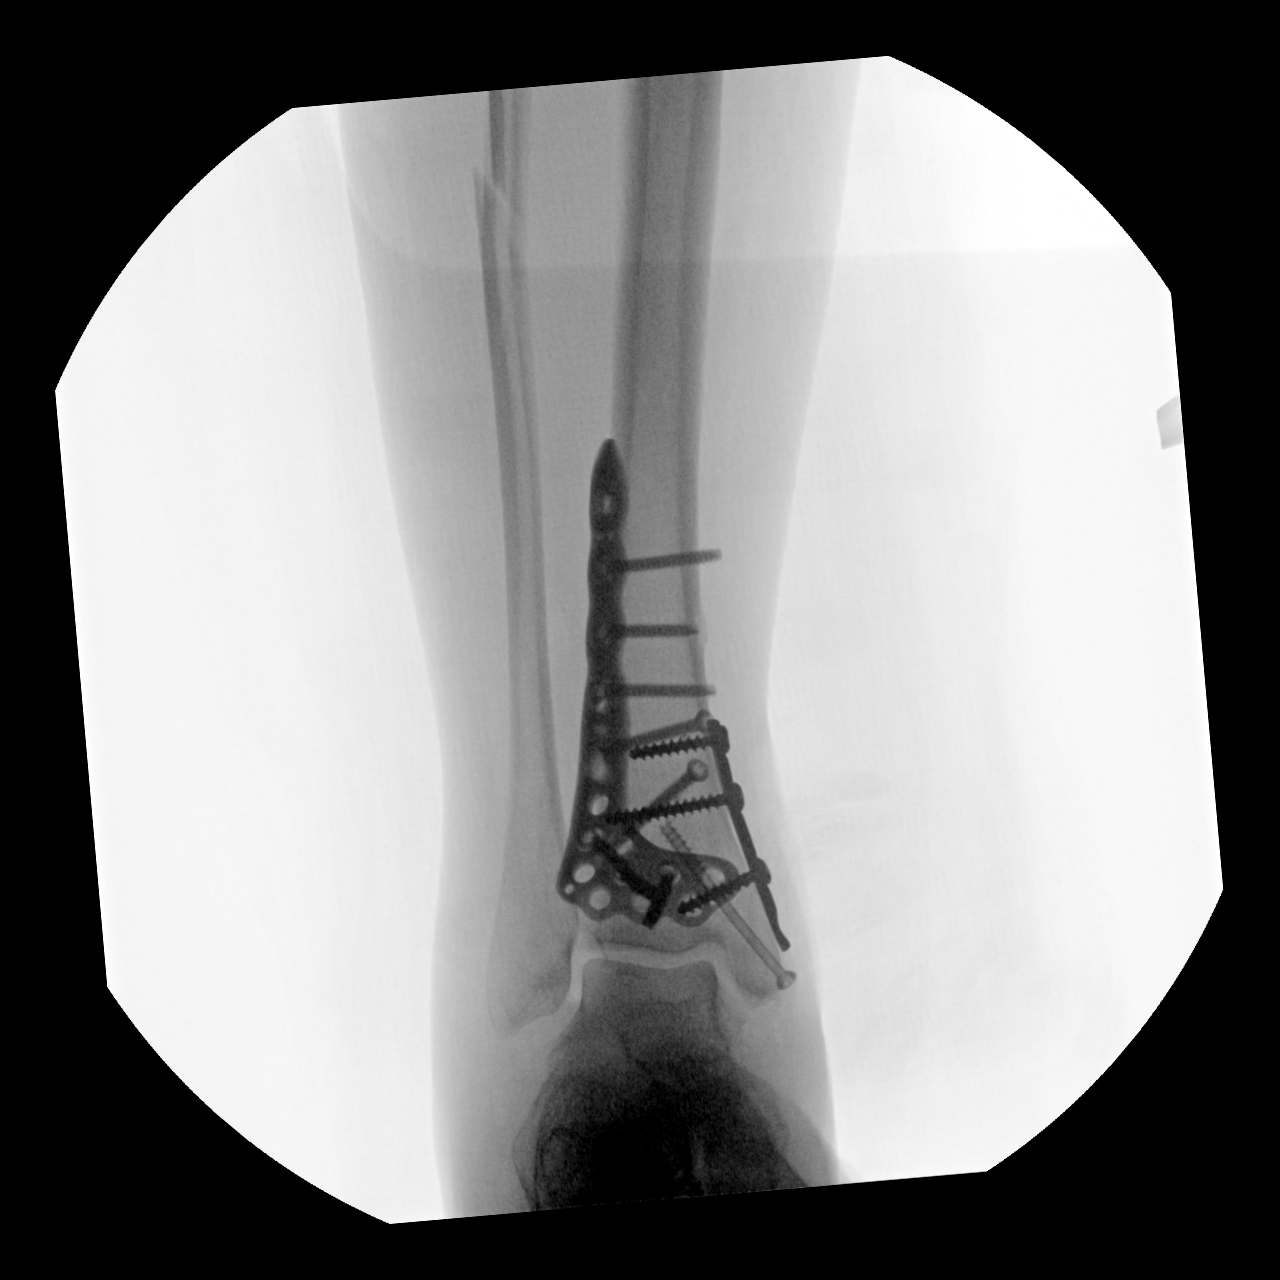
[im 3/3]
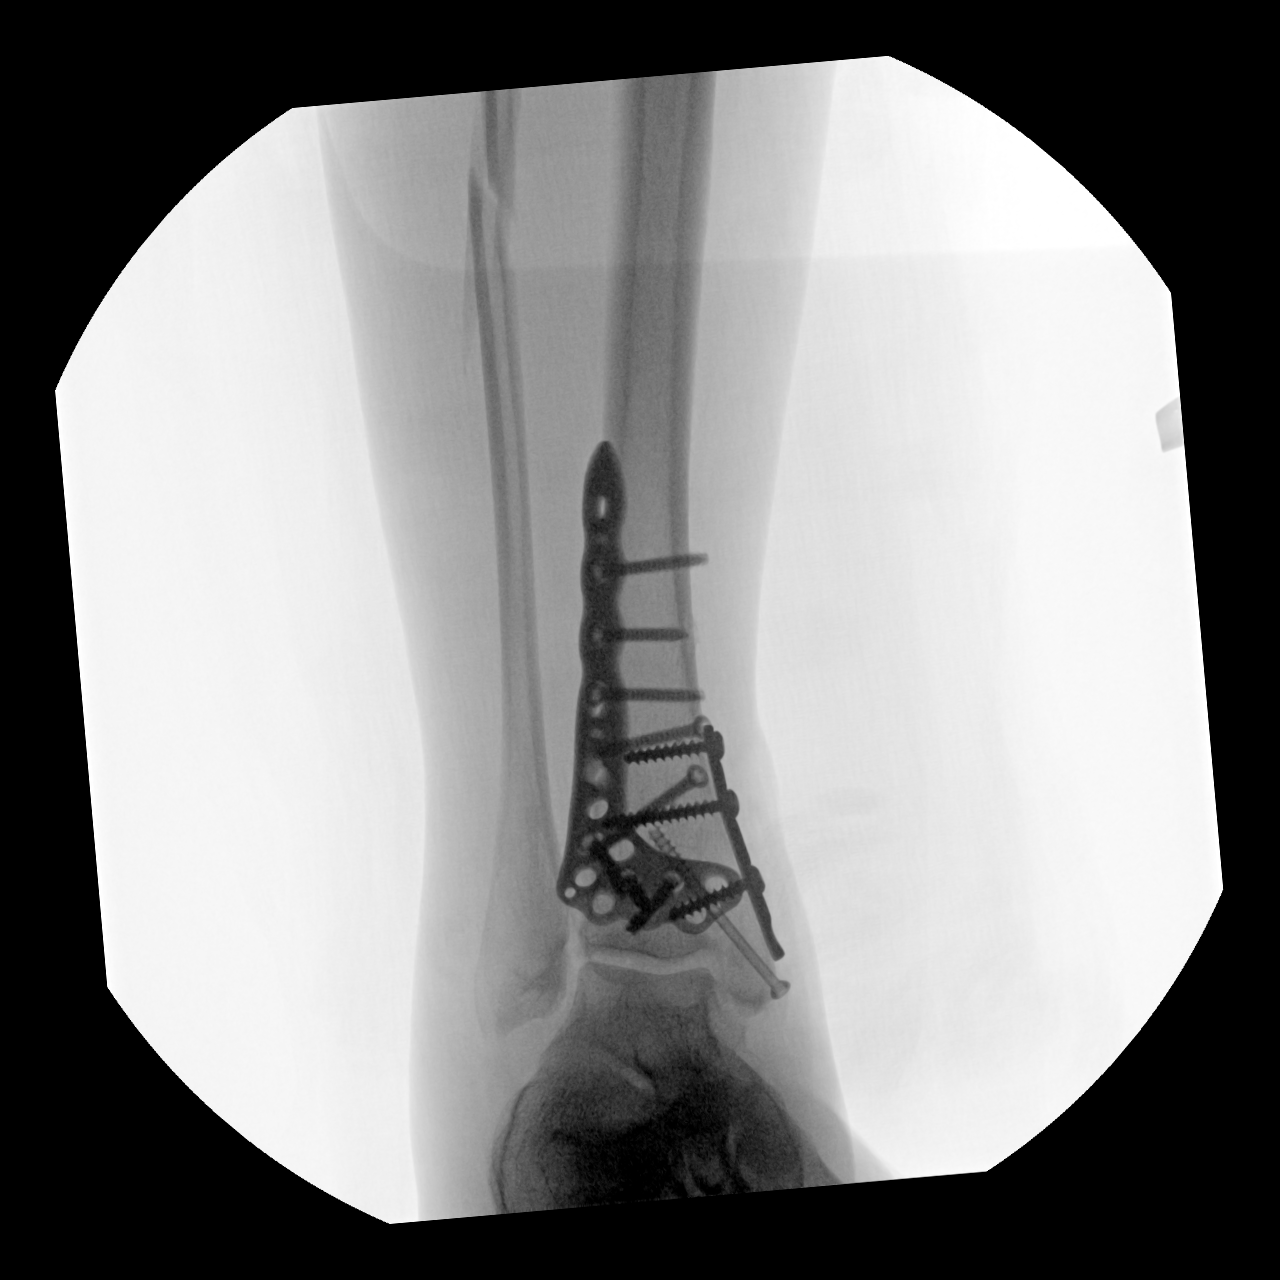

[3 of 3 positions shown; findings below may reference images not displayed]

FINDINGS: Spot fluoroscopic intraoperative views demonstrate plate screw
fixation of the right distal tibia with anatomic alignment.

Midshaft fibular fracture with a mildly displaced posterior
butterfly fragment noted.

Diffuse extremity edema present.
IMPRESSION: Status post ORIF of the right ankle distal tibia fracture with
anatomic alignment.

No complicating feature

Midshaft displaced fibular fracture again noted.

## 2022-11-23 ENCOUNTER — Ambulatory Visit
Admission: RE | Admit: 2022-11-23 | Discharge: 2022-11-23 | Disposition: A | Discharge status: Home / Self Care | Payer: Medicaid Other | Source: Ambulatory Visit | Attending: Family Medicine | Admitting: Family Medicine

## 2022-11-23 ENCOUNTER — Other Ambulatory Visit: Payer: Self-pay | Admitting: Family Medicine

## 2022-11-23 DIAGNOSIS — Z1231 Encounter for screening mammogram for malignant neoplasm of breast: Secondary | ICD-10-CM

## 2023-08-28 ENCOUNTER — Other Ambulatory Visit: Payer: Self-pay

## 2023-08-28 DIAGNOSIS — Z021 Encounter for pre-employment examination: Secondary | ICD-10-CM

## 2023-08-28 NOTE — Progress Notes (Signed)
 Presents to [employer] for pre-employment drug screen for PT [employer]  [facility] Acct #:  [id number] [facility] Specimen #:  [id number]  Rapid drug screen results = Negative

## 2023-12-26 ENCOUNTER — Other Ambulatory Visit: Payer: Self-pay | Admitting: Family Medicine

## 2023-12-26 DIAGNOSIS — Z1231 Encounter for screening mammogram for malignant neoplasm of breast: Secondary | ICD-10-CM

## 2024-01-11 ENCOUNTER — Encounter

## 2024-01-11 DIAGNOSIS — Z1231 Encounter for screening mammogram for malignant neoplasm of breast: Secondary | ICD-10-CM
